# Patient Record
Sex: Female | Born: 1965
Health system: Southern US, Community
[De-identification: ages and names within clinical notes are randomized; demographics above are authoritative.]

## PROBLEM LIST (undated history)

## (undated) DIAGNOSIS — E559 Vitamin D deficiency, unspecified: Secondary | ICD-10-CM

## (undated) DIAGNOSIS — R0600 Dyspnea, unspecified: Secondary | ICD-10-CM

## (undated) DIAGNOSIS — K219 Gastro-esophageal reflux disease without esophagitis: Secondary | ICD-10-CM

## (undated) DIAGNOSIS — E039 Hypothyroidism, unspecified: Secondary | ICD-10-CM

## (undated) DIAGNOSIS — Z9889 Other specified postprocedural states: Secondary | ICD-10-CM

## (undated) DIAGNOSIS — E785 Hyperlipidemia, unspecified: Secondary | ICD-10-CM

## (undated) DIAGNOSIS — J339 Nasal polyp, unspecified: Secondary | ICD-10-CM

## (undated) DIAGNOSIS — J45909 Unspecified asthma, uncomplicated: Secondary | ICD-10-CM

## (undated) HISTORY — DX: Gastro-esophageal reflux disease without esophagitis: K21.9

## (undated) HISTORY — DX: Unspecified asthma, uncomplicated: J45.909

## (undated) HISTORY — DX: Vitamin D deficiency, unspecified: E55.9

## (undated) HISTORY — PX: CARPAL TUNNEL RELEASE: SHX101

## (undated) HISTORY — DX: Hypothyroidism, unspecified: E03.9

## (undated) HISTORY — DX: Nasal polyp, unspecified: J33.9

## (undated) HISTORY — PX: TMJ ARTHROPLASTY: SHX1066

## (undated) HISTORY — DX: Hyperlipidemia, unspecified: E78.5

---

## 2001-05-11 ENCOUNTER — Encounter: Admission: RE | Admit: 2001-05-11 | Discharge: 2001-05-11 | Payer: Self-pay | Admitting: Family Medicine

## 2001-05-11 ENCOUNTER — Encounter: Payer: Self-pay | Admitting: Family Medicine

## 2001-11-30 ENCOUNTER — Other Ambulatory Visit: Admission: RE | Admit: 2001-11-30 | Discharge: 2001-11-30 | Payer: Self-pay | Admitting: Physical Therapy

## 2002-12-26 ENCOUNTER — Other Ambulatory Visit: Admission: RE | Admit: 2002-12-26 | Discharge: 2002-12-26 | Payer: Self-pay | Admitting: Obstetrics and Gynecology

## 2004-01-01 ENCOUNTER — Other Ambulatory Visit: Admission: RE | Admit: 2004-01-01 | Discharge: 2004-01-01 | Payer: Self-pay | Admitting: Obstetrics and Gynecology

## 2005-03-01 ENCOUNTER — Other Ambulatory Visit: Admission: RE | Admit: 2005-03-01 | Discharge: 2005-03-01 | Payer: Self-pay | Admitting: Obstetrics and Gynecology

## 2006-03-09 ENCOUNTER — Other Ambulatory Visit: Admission: RE | Admit: 2006-03-09 | Discharge: 2006-03-09 | Payer: Self-pay | Admitting: Obstetrics & Gynecology

## 2006-03-14 ENCOUNTER — Encounter: Admission: RE | Admit: 2006-03-14 | Discharge: 2006-03-14 | Payer: Self-pay | Admitting: Obstetrics & Gynecology

## 2006-04-01 ENCOUNTER — Encounter: Admission: RE | Admit: 2006-04-01 | Discharge: 2006-04-01 | Payer: Self-pay | Admitting: *Deleted

## 2007-03-27 ENCOUNTER — Other Ambulatory Visit: Admission: RE | Admit: 2007-03-27 | Discharge: 2007-03-27 | Payer: Self-pay | Admitting: Obstetrics & Gynecology

## 2007-10-14 ENCOUNTER — Emergency Department (HOSPITAL_COMMUNITY): Admission: EM | Admit: 2007-10-14 | Discharge: 2007-10-14 | Payer: Self-pay | Admitting: Emergency Medicine

## 2007-10-14 ENCOUNTER — Encounter: Payer: Self-pay | Admitting: Emergency Medicine

## 2008-04-03 ENCOUNTER — Other Ambulatory Visit: Admission: RE | Admit: 2008-04-03 | Discharge: 2008-04-03 | Payer: Self-pay | Admitting: Obstetrics & Gynecology

## 2008-04-09 ENCOUNTER — Encounter: Admission: RE | Admit: 2008-04-09 | Discharge: 2008-04-09 | Payer: Self-pay | Admitting: Obstetrics & Gynecology

## 2009-10-13 ENCOUNTER — Encounter: Admission: RE | Admit: 2009-10-13 | Discharge: 2009-10-13 | Payer: Self-pay | Admitting: Obstetrics & Gynecology

## 2011-05-16 LAB — DIFFERENTIAL
Basophils Absolute: 0
Basophils Absolute: 0
Basophils Relative: 0
Eosinophils Relative: 3
Lymphocytes Relative: 18
Lymphocytes Relative: 21
Lymphs Abs: 1.6
Monocytes Absolute: 0.4
Neutro Abs: 4.7
Neutro Abs: 5.4

## 2011-05-16 LAB — BASIC METABOLIC PANEL
BUN: 14
CO2: 26
CO2: 26
Calcium: 9.3
Chloride: 108
GFR calc Af Amer: >60
GFR calc non Af Amer: >60
Glucose, Bld: 104 — ABNORMAL HIGH
Potassium: 4.2
Sodium: 140
Sodium: 141

## 2011-05-16 LAB — CBC
Hemoglobin: 13.5
MCHC: 34.8
Platelets: 289
Platelets: 321
RDW: 13
RDW: 13.5
WBC: 7.5

## 2012-08-29 ENCOUNTER — Other Ambulatory Visit: Payer: Self-pay | Admitting: Obstetrics & Gynecology

## 2012-08-29 DIAGNOSIS — Z1231 Encounter for screening mammogram for malignant neoplasm of breast: Secondary | ICD-10-CM

## 2012-09-24 ENCOUNTER — Ambulatory Visit
Admission: RE | Admit: 2012-09-24 | Discharge: 2012-09-24 | Disposition: A | Discharge status: Home / Self Care | Payer: Managed Care, Other (non HMO) | Source: Ambulatory Visit | Attending: Obstetrics & Gynecology | Admitting: Obstetrics & Gynecology

## 2012-09-24 DIAGNOSIS — Z1231 Encounter for screening mammogram for malignant neoplasm of breast: Secondary | ICD-10-CM

## 2012-12-03 ENCOUNTER — Telehealth: Payer: Self-pay | Admitting: Obstetrics & Gynecology

## 2012-12-03 MED ORDER — NORETHINDRONE 0.35 MG PO TABS: 1.0000 | ORAL_TABLET | Freq: Every day | ORAL | Status: DC

## 2012-12-03 NOTE — Telephone Encounter (Signed)
Not uncommon to skip cycles on [name].  Have pt do a home UPT.  If neg--she is fine.  OV if skips another month.

## 2012-12-03 NOTE — Telephone Encounter (Signed)
Yes, fine to refill until AEX.

## 2012-12-03 NOTE — Telephone Encounter (Signed)
09/22/12 no cycle since that date/pt concerned and wants to speak with nurse/wants to move appt. up if necessary/[place]

## 2012-12-03 NOTE — Telephone Encounter (Signed)
Spoke with pt to advise we will refill her OCP until her AEX. Pt uses [facility].  aa

## 2012-12-03 NOTE — Telephone Encounter (Signed)
Spoke with pt who is on Camila. Has not had a cycle since 09-22-12. Has not missed any pills. Pt has a history of missing 1 or 2 periods while on Camila, but not 2 cycles in a row. Pt just wanted to make SM aware and check to see if this is normal, or if she needs to come in. Please advise.  aa

## 2012-12-03 NOTE — Telephone Encounter (Signed)
Spoke with pt to advise skipping periods on Sara Nichols is common. Instructed pt to do a home pregnancy test to be sure. Call back for OV if she skips another month. Pt reports she needs a refill on her Sara Nichols until her AEX in June. OK to refill until then?  aa

## 2013-01-18 ENCOUNTER — Other Ambulatory Visit: Payer: Self-pay | Admitting: Family Medicine

## 2013-01-18 ENCOUNTER — Ambulatory Visit
Admission: RE | Admit: 2013-01-18 | Discharge: 2013-01-18 | Disposition: A | Discharge status: Home / Self Care | Payer: Managed Care, Other (non HMO) | Source: Ambulatory Visit | Attending: Family Medicine | Admitting: Family Medicine

## 2013-01-18 DIAGNOSIS — R05 Cough: Secondary | ICD-10-CM

## 2013-01-18 DIAGNOSIS — M542 Cervicalgia: Secondary | ICD-10-CM

## 2013-01-25 ENCOUNTER — Encounter: Payer: Self-pay | Admitting: Internal Medicine

## 2013-01-28 ENCOUNTER — Institutional Professional Consult (permissible substitution): Payer: Managed Care, Other (non HMO) | Admitting: Internal Medicine

## 2013-02-07 ENCOUNTER — Ambulatory Visit: Payer: Self-pay | Admitting: Obstetrics & Gynecology

## 2013-02-10 ENCOUNTER — Encounter: Payer: Self-pay | Admitting: Obstetrics & Gynecology

## 2013-02-12 ENCOUNTER — Encounter: Payer: Self-pay | Admitting: Obstetrics & Gynecology

## 2013-02-12 ENCOUNTER — Ambulatory Visit (INDEPENDENT_AMBULATORY_CARE_PROVIDER_SITE_OTHER): Payer: Managed Care, Other (non HMO) | Admitting: Obstetrics & Gynecology

## 2013-02-12 VITALS — BP 108/68 | HR 72 | Resp 16 | Ht 60.5 in | Wt 138.0 lb

## 2013-02-12 DIAGNOSIS — Z01419 Encounter for gynecological examination (general) (routine) without abnormal findings: Secondary | ICD-10-CM

## 2013-02-12 MED ORDER — NORETHINDRONE 0.35 MG PO TABS: 1.0000 | ORAL_TABLET | Freq: Every day | ORAL | Status: DC

## 2013-02-12 NOTE — Progress Notes (Signed)
47 y.o. G1P0000 MarriedCaucasianF here for annual exam.  No vaginal bleeding.  Doing really well.   Patient's last menstrual period was 01/24/2013.          Sexually active: yes  The current method of family planning is OCP (estrogen/progesterone).    Exercising: yes  Home exercise routine includes calisthenics. Smoker:  no  Health Maintenance: Pap:  01/17/12 normal, negative HPV History of abnormal Pap:  no MMG:  09/24/12 Colonoscopy:  n/a BMD:   n/a TDaP:  03/27/07 Screening Labs: pcp, Hb today: pcp, Urine today: declined labs today recent visit with PCP   reports that she has never smoked. She does not have any smokeless tobacco history on file. She reports that she does not drink alcohol or use illicit drugs.  Past Medical History  Diagnosis Date  . Hypothyroidism   . Asthma   . GERD (gastroesophageal reflux disease)   . Hyperlipidemia     since resolved  . Chronic cough   . Mild vitamin D deficiency     Past Surgical History  Procedure Laterality Date  . Tmj arthroplasty    . Iud removal    . Intrauterine device (iud) insertion      Current Outpatient Prescriptions  Medication Sig Dispense Refill  . Multiple Vitamins-Minerals (MULTIVITAMIN WITH MINERALS) tablet Take 1 tablet by mouth daily.      . norethindrone (MICRONOR,CAMILA,ERRIN) 0.35 MG tablet Take 1 tablet (0.35 mg total) by mouth daily.  3 Package  0  . ranitidine (ZANTAC) 300 MG capsule       . SYMBICORT 160-4.5 MCG/ACT inhaler Inhale 1 puff into the lungs 2 (two) times daily.      Marland Kitchen SYNTHROID 88 MCG tablet Take 88 mcg by mouth daily.       No current facility-administered medications for this visit.    Family History  Problem Relation Age of Onset  . Breast cancer Paternal Grandmother   . Colon cancer Maternal Grandfather 80  . Thyroid disease Mother   . Asthma Father     uses inhaler  . Hypertension Sister     ROS:  Pertinent items are noted in HPI.  Otherwise, a comprehensive ROS was  negative.  Exam:   BP 108/68  Pulse 72  Resp 16  Ht 5' 0.5" (1.537 m)  Wt 138 lb (62.596 kg)  BMI 26.5 kg/m2  LMP 01/24/2013  Weight change: @WEIGHTCHANGE @ Height:   Height: 5' 0.5" (153.7 cm)  Ht Readings from Last 3 Encounters:  02/12/13 5' 0.5" (1.537 m)    General appearance: alert, cooperative and appears stated age Head: Normocephalic, without obvious abnormality, atraumatic Neck: no adenopathy, supple, symmetrical, trachea midline and thyroid normal to inspection and palpation Lungs: clear to auscultation bilaterally Breasts: normal appearance, no masses or tenderness Heart: regular rate and rhythm Abdomen: soft, non-tender; bowel sounds normal; no masses,  no organomegaly Extremities: extremities normal, atraumatic, no cyanosis or edema Skin: Skin color, texture, turgor normal. No rashes or lesions Lymph nodes: Cervical, supraclavicular, and axillary nodes normal. No abnormal inguinal nodes palpated Neurologic: Grossly normal   Pelvic: External genitalia:  no lesions              Urethra:  normal appearing urethra with no masses, tenderness or lesions              Bartholins and Skenes: normal                 Vagina: normal appearing vagina with normal color and  discharge, no lesions              Cervix: no lesions              Pap taken: no Bimanual Exam:  Uterus:  normal size, contour, position, consistency, mobility, non-tender              Adnexa: normal adnexa and no mass, fullness, tenderness               Rectovaginal: Confirms               Anus:  normal sphincter tone, no lesions  A:  Well Woman with normal exam On [name] for B.C. Asthma, elevated lipids, hypothyroidism Family hx of breast cancer, MGM  P:   Mammogram yearly.  D/W pt 3D mmg. pap smear with neg HR HPV one year ago. Labs with PCP. Rx for camilla to pharmacy. return annually or prn  An After Visit Summary was printed and given to the patient.

## 2013-02-12 NOTE — Patient Instructions (Addendum)

## 2013-03-18 ENCOUNTER — Institutional Professional Consult (permissible substitution): Payer: Managed Care, Other (non HMO) | Admitting: Internal Medicine

## 2014-01-15 ENCOUNTER — Other Ambulatory Visit: Payer: Self-pay

## 2014-01-15 DIAGNOSIS — Z1231 Encounter for screening mammogram for malignant neoplasm of breast: Secondary | ICD-10-CM

## 2014-01-20 ENCOUNTER — Ambulatory Visit
Admission: RE | Admit: 2014-01-20 | Discharge: 2014-01-20 | Disposition: A | Discharge status: Home / Self Care | Payer: Managed Care, Other (non HMO) | Source: Ambulatory Visit

## 2014-01-20 ENCOUNTER — Encounter (INDEPENDENT_AMBULATORY_CARE_PROVIDER_SITE_OTHER): Payer: Self-pay

## 2014-01-20 DIAGNOSIS — Z1231 Encounter for screening mammogram for malignant neoplasm of breast: Secondary | ICD-10-CM

## 2014-02-19 ENCOUNTER — Telehealth: Payer: Self-pay | Admitting: *Deleted

## 2014-02-19 MED ORDER — NORETHINDRONE 0.35 MG PO TABS: 1.0000 | ORAL_TABLET | Freq: Every day | ORAL | Status: DC

## 2014-02-19 NOTE — Telephone Encounter (Signed)
[facility] Norethind[facility] 0.35 mg  Last Refilled: 02/12/13 #3 packs 4 refills  Aex Scheduled: 04/17/14 with Dr. Sabra Heck Last Mammogram: 01/20/14 Bi-Rads 1  Norethindrone 0.35 mg #3 packs w/ no refills sent to [facility] [place]  Routed to provider for review, encounter closed.

## 2014-02-20 MED ORDER — NORETHINDRONE 0.35 MG PO TABS: 1.0000 | ORAL_TABLET | Freq: Every day | ORAL | Status: DC

## 2014-02-20 NOTE — Addendum Note (Signed)
Addended by: Alfonzo Feller on: 02/20/2014 11:10 AM   Modules accepted: Orders

## 2014-04-17 ENCOUNTER — Ambulatory Visit (INDEPENDENT_AMBULATORY_CARE_PROVIDER_SITE_OTHER): Payer: Managed Care, Other (non HMO) | Admitting: Obstetrics & Gynecology

## 2014-04-17 ENCOUNTER — Encounter: Payer: Self-pay | Admitting: Obstetrics & Gynecology

## 2014-04-17 VITALS — BP 112/70 | HR 68 | Resp 16 | Ht 60.5 in | Wt 147.0 lb

## 2014-04-17 DIAGNOSIS — Z124 Encounter for screening for malignant neoplasm of cervix: Secondary | ICD-10-CM

## 2014-04-17 DIAGNOSIS — Z0000 Encounter for general adult medical examination without abnormal findings: Secondary | ICD-10-CM

## 2014-04-17 DIAGNOSIS — Z01419 Encounter for gynecological examination (general) (routine) without abnormal findings: Secondary | ICD-10-CM

## 2014-04-17 LAB — POCT URINALYSIS DIPSTICK
Bilirubin, UA: NEGATIVE
Glucose, UA: NEGATIVE
Ketones, UA: NEGATIVE
Leukocytes, UA: NEGATIVE
NITRITE UA: NEGATIVE
PH UA: 5
Protein, UA: NEGATIVE
RBC UA: NEGATIVE
UROBILINOGEN UA: NEGATIVE

## 2014-04-17 MED ORDER — NORETHINDRONE 0.35 MG PO TABS: 1.0000 | ORAL_TABLET | Freq: Every day | ORAL | Status: DC

## 2014-04-17 NOTE — Progress Notes (Signed)
48 y.o. G0P0000 MarriedCaucasianF here for annual exam.  Doing well.  Does skip cycles on occasion.  Cycle lasts up to seven days but usually they are shorter then that and light.  Saw Dr. Kenton Kingfisher in may.  Labs were all normal including cholesterol and thyroid.     Patient's last menstrual period was 04/10/2014.          Sexually active: Yes.    The current method of family planning is oral progesterone-only contraceptive.    Exercising: No.  exercise Smoker:  no  Health Maintenance: Pap: 01-17-12 neg HPV HR neg History of abnormal Pap:  no MMG: 01-20-14, neg,  Did 3D. Colonoscopy:  none BMD:   none TDaP:  03-27-07 Screening Labs: pcp, Hb today: pcp, Urine today: neg Self breast exam: done occ   reports that she has never smoked. She does not have any smokeless tobacco history on file. She reports that she drinks alcohol. She reports that she does not use illicit drugs.  Past Medical History  Diagnosis Date  . Hypothyroidism   . Asthma   . GERD (gastroesophageal reflux disease)   . Hyperlipidemia   . Mild vitamin D deficiency     Past Surgical History  Procedure Laterality Date  . Tmj arthroplasty      Current Outpatient Prescriptions  Medication Sig Dispense Refill  . DULERA 200-5 MCG/ACT AERO       . Multiple Vitamins-Minerals (MULTIVITAMIN WITH MINERALS) tablet Take 1 tablet by mouth daily.      . norethindrone (MICRONOR,CAMILA,ERRIN) 0.35 MG tablet Take 1 tablet (0.35 mg total) by mouth daily.  3 Package  0  . ranitidine (ZANTAC) 300 MG capsule       . SYNTHROID 88 MCG tablet Take 88 mcg by mouth daily.       No current facility-administered medications for this visit.    Family History  Problem Relation Age of Onset  . Breast cancer Paternal Grandmother   . Colon cancer Maternal Grandfather 30  . Thyroid disease Mother   . Asthma Father     uses inhaler  . Cancer Father     prostate & blood  . Hypertension Sister     ROS:  Pertinent items are noted in HPI.   Otherwise, a comprehensive ROS was negative.  Exam:   BP 112/70  Pulse 68  Resp 16  Ht 5' 0.5" (1.537 m)  Wt 147 lb (66.679 kg)  BMI 28.23 kg/m2  LMP 04/10/2014  Weight change: +9#   Height: 5' 0.5" (153.7 cm)  Ht Readings from Last 3 Encounters:  04/17/14 5' 0.5" (1.537 m)  02/12/13 5' 0.5" (1.537 m)    General appearance: alert, cooperative and appears stated age Head: Normocephalic, without obvious abnormality, atraumatic Neck: no adenopathy, supple, symmetrical, trachea midline and thyroid normal to inspection and palpation Lungs: clear to auscultation bilaterally Breasts: normal appearance, no masses or tenderness Heart: regular rate and rhythm Abdomen: soft, non-tender; bowel sounds normal; no masses,  no organomegaly Extremities: extremities normal, atraumatic, no cyanosis or edema Skin: Skin color, texture, turgor normal. No rashes or lesions Lymph nodes: Cervical, supraclavicular, and axillary nodes normal. No abnormal inguinal nodes palpated Neurologic: Grossly normal   Pelvic: External genitalia:  no lesions              Urethra:  normal appearing urethra with no masses, tenderness or lesions              Bartholins and Skenes: normal  Vagina: normal appearing vagina with normal color and discharge, no lesions              Cervix: no lesions              Pap taken: Yes.   Bimanual Exam:  Uterus:  normal size, contour, position, consistency, mobility, non-tender              Adnexa: normal adnexa and no mass, fullness, tenderness               Rectovaginal: Confirms               Anus:  normal sphincter tone, no lesions  A:  Well Woman with normal exam  On [name] for B.C.  Asthma H/O elevated lipids in past.  Normal currently Hypothyroidism  Family hx of breast cancer, MGM   P: Mammogram yearly. D/W pt 3D mmg.  pap smear with neg HR HPV 2013.  Pap only today.  Labs with PCP.  Rx for camilla to pharmacy.  return annually or prn     An  After Visit Summary was printed and given to the patient.

## 2014-04-21 LAB — IPS PAP TEST WITH REFLEX TO HPV

## 2015-02-03 ENCOUNTER — Other Ambulatory Visit: Payer: Self-pay | Admitting: Obstetrics & Gynecology

## 2015-02-03 ENCOUNTER — Other Ambulatory Visit: Payer: Self-pay

## 2015-02-03 DIAGNOSIS — Z1231 Encounter for screening mammogram for malignant neoplasm of breast: Secondary | ICD-10-CM

## 2015-02-03 MED ORDER — NORETHINDRONE 0.35 MG PO TABS: 1.0000 | ORAL_TABLET | Freq: Every day | ORAL | Status: DC

## 2015-02-03 NOTE — Telephone Encounter (Signed)
Medication refill request: Norethindrone Last AEX:  04-17-14 Next AEX: 06-01-15 Last MMG (if hormonal medication request): 01-21-14 WNL Refill authorized: WNL

## 2015-02-03 NOTE — Telephone Encounter (Signed)
Patient is requesting a refills of birth control until her next appointment 06/01/15 with Dr.Miller. Confirmed pharmacy on file with patient. Patient is requesting 3 refills.

## 2015-03-03 ENCOUNTER — Ambulatory Visit: Payer: Managed Care, Other (non HMO)

## 2015-04-01 ENCOUNTER — Ambulatory Visit
Admission: RE | Admit: 2015-04-01 | Discharge: 2015-04-01 | Disposition: A | Discharge status: Home / Self Care | Payer: BLUE CROSS/BLUE SHIELD | Source: Ambulatory Visit

## 2015-04-01 DIAGNOSIS — Z1231 Encounter for screening mammogram for malignant neoplasm of breast: Secondary | ICD-10-CM

## 2015-05-22 ENCOUNTER — Ambulatory Visit: Payer: Managed Care, Other (non HMO) | Admitting: Obstetrics & Gynecology

## 2015-06-01 ENCOUNTER — Ambulatory Visit (INDEPENDENT_AMBULATORY_CARE_PROVIDER_SITE_OTHER): Payer: BLUE CROSS/BLUE SHIELD | Admitting: Obstetrics & Gynecology

## 2015-06-01 ENCOUNTER — Encounter: Payer: Self-pay | Admitting: Obstetrics & Gynecology

## 2015-06-01 VITALS — BP 112/90 | HR 72 | Resp 18 | Wt 133.0 lb

## 2015-06-01 DIAGNOSIS — Z01419 Encounter for gynecological examination (general) (routine) without abnormal findings: Secondary | ICD-10-CM

## 2015-06-01 DIAGNOSIS — N951 Menopausal and female climacteric states: Secondary | ICD-10-CM | POA: Diagnosis not present

## 2015-06-01 DIAGNOSIS — R232 Flushing: Secondary | ICD-10-CM

## 2015-06-01 MED ORDER — NORETHINDRONE 0.35 MG PO TABS: 1.0000 | ORAL_TABLET | Freq: Every day | ORAL | Status: DC

## 2015-06-01 NOTE — Progress Notes (Addendum)
49 y.o. G0P0000 MarriedCaucasianF here for annual exam.  Reports she has skipped some cycles this past year.  She is on POPs.  Reports hot flashes have worsened this year.    Pt had thyroid tested in September.  Pt reports it was a little low.  Her dosage wasn't decreased but repeat labs were done last week.  Pt is waiting to hear about this results.  Had other blood work done this year as well.  Pt wants me to look at a couple of places on her skin.    Patient's last menstrual period was 03/25/2015 (approximate).          Sexually active: Yes.    The current method of family planning is OCP (estrogen/progesterone).    Exercising: Yes.    Walking Smoker:  no  Health Maintenance: Pap:  04/17/14 Neg, neg pap with neg HR HPV 2013.   History of abnormal Pap:  no MMG:  04/02/15 BIRADS1:neg TDaP:  2008 Screening Labs: PCP, Hb today: PCP, Urine today: PCP   reports that she has never smoked. She has never used smokeless tobacco. She reports that she drinks alcohol. She reports that she does not use illicit drugs.  Past Medical History  Diagnosis Date  . Hypothyroidism   . Asthma   . GERD (gastroesophageal reflux disease)   . Hyperlipidemia   . Mild vitamin D deficiency     Past Surgical History  Procedure Laterality Date  . Tmj arthroplasty      Current Outpatient Prescriptions  Medication Sig Dispense Refill  . DULERA 200-5 MCG/ACT AERO     . Multiple Vitamins-Minerals (MULTIVITAMIN WITH MINERALS) tablet Take 1 tablet by mouth daily.    . norethindrone (MICRONOR,CAMILA,ERRIN) 0.35 MG tablet Take 1 tablet (0.35 mg total) by mouth daily. 3 Package 1  . ranitidine (ZANTAC) 300 MG capsule     . SYNTHROID 88 MCG tablet Take 88 mcg by mouth daily.    . valACYclovir (VALTREX) 1000 MG tablet      No current facility-administered medications for this visit.    Family History  Problem Relation Age of Onset  . Breast cancer Paternal Grandmother   . Colon cancer Maternal Grandfather 43   . Thyroid disease Mother   . Asthma Father     uses inhaler  . Cancer Father     prostate & blood  . Hypertension Sister     ROS:  Pertinent items are noted in HPI.  Otherwise, a comprehensive ROS was negative.  Exam:   BP 112/90 mmHg  Pulse 72  Resp 18  Wt 133 lb (60.328 kg)  LMP 03/25/2015 (Approximate)  Weight change: --14#     Ht Readings from Last 3 Encounters:  04/17/14 5' 0.5" (1.537 m)  02/12/13 5' 0.5" (1.537 m)    General appearance: alert, cooperative and appears stated age Head: Normocephalic, without obvious abnormality, atraumatic Neck: no adenopathy, supple, symmetrical, trachea midline and thyroid normal to inspection and palpation Lungs: clear to auscultation bilaterally Breasts: normal appearance, no masses or tenderness Heart: regular rate and rhythm Abdomen: soft, non-tender; bowel sounds normal; no masses,  no organomegaly Extremities: extremities normal, atraumatic, no cyanosis or edema Skin: Skin color, texture, turgor normal. No rashes or lesions Lymph nodes: Cervical, supraclavicular, and axillary nodes normal. No abnormal inguinal nodes palpated Neurologic: Grossly normal   Pelvic: External genitalia:  no lesions              Urethra:  normal appearing urethra with no masses,  tenderness or lesions              Bartholins and Skenes: normal                 Vagina: normal appearing vagina with normal color and discharge, no lesions              Cervix: no lesions              Pap taken: No. Bimanual Exam:  Uterus:  normal size, contour, position, consistency, mobility, non-tender              Adnexa: normal adnexa and no mass, fullness, tenderness               Rectovaginal: Confirms               Anus:  normal sphincter tone, no lesions  Chaperone was present for exam.  A:  Well Woman with normal exam  On [name] for B.C.  Asthma H/O elevated lipids in past. Normal currently Hypothyroidism  Hot flashes Family hx of breast cancer,  MGM   P: Mammogram yearly. D/W pt 3D mmg.  pap smear with neg HR HPV 2013. Pap neg 2015.  No pap today. Labs with PCP.  Rx for camilla to pharmacy for 90 day supply/4RF. [place] today.  (was 33.9 in 2010) Name and number for dermatologist given. return annually or prn

## 2015-06-01 NOTE — Addendum Note (Signed)
Addended by: Megan Salon on: 06/01/2015 02:26 PM   Modules accepted: Sara Nichols

## 2015-06-01 NOTE — Patient Instructions (Signed)
Sara Nichols, [facility] Phone: [phone]

## 2015-06-02 ENCOUNTER — Encounter: Payer: Self-pay | Admitting: Obstetrics & Gynecology

## 2015-06-02 ENCOUNTER — Telehealth: Payer: Self-pay

## 2015-06-02 LAB — FOLLICLE STIMULATING HORMONE: FSH: 65 m[IU]/mL

## 2015-06-02 NOTE — Telephone Encounter (Signed)
-----   Message from Megan Salon, MD sent at 06/02/2015 11:12 AM EDT ----- Please inform pt she is in full menopausal range.  I know she is not interested in taking estrogen.  So she should use black cohosh 40mg  bid and if she wants to try some topical progesterone cream from health food store, she can also do this.  She should follow instructions on the package for this.  Also, she should not have another cycle and if she does, I want her to call.  It is common for this level to bounce around a little in women who are younger so I would stay on the micronor for one more year and I will repeat the level then OR she can stop it but use condoms just to be safe.

## 2015-06-02 NOTE — Telephone Encounter (Signed)
Returned call

## 2015-06-02 NOTE — Telephone Encounter (Signed)
Lmtcb//kn 

## 2015-06-07 ENCOUNTER — Encounter: Payer: Self-pay | Admitting: Obstetrics & Gynecology

## 2015-06-09 NOTE — Telephone Encounter (Signed)
Lmtcb//kn 

## 2015-06-22 NOTE — Telephone Encounter (Signed)
Patient notified of results.//kn 

## 2015-06-22 NOTE — Telephone Encounter (Signed)
-----   Message from Megan Salon, MD sent at 06/03/2015  8:22 AM EDT ----- Regarding: mychart message Mrs Dahlia Client sent a mychart message and I answered her questions with the following.  You can document on your note that she was communicated with via MyChart and cut and paste this if you want into the note.  Thanks.  Vinnie Level  Pt note:  Your Tyro level showed you were in full menopausal range.  That is why you are experiencing the symptoms you currently have.  I sometimes see this level bounce around in women in their 79's who go into menopause a little early.  It is safe to stay on the oral contraception or you can stop this but I would probably use condoms to be sure.  I will plan to repeat the level next year.  If it is still elevated, then we know there is no risk for pregnancy and you can stop the contraception completely.  You should have not any bleeding, so if you do, please call.  I would do an ultrasound at that point.  I don't think you want to be on estrogens so I would either restart the Estroven as it helped or try the black cohosh, 40mg  twice daily.  Hope that is helpful.  Please let me know if you have any other questions.  Dr. Sabra Heck

## 2015-10-28 ENCOUNTER — Telehealth: Payer: Self-pay

## 2015-10-28 ENCOUNTER — Encounter: Payer: Self-pay | Admitting: Obstetrics & Gynecology

## 2015-10-28 NOTE — Telephone Encounter (Signed)
Telephone encounter created to discuss mychart encounter with patient and Dr.Miller.

## 2015-10-28 NOTE — Telephone Encounter (Signed)
Non-Urgent Medical Question  Message [id number]   From  [place]   To  Megan Salon, MD   Sent  10/28/2015 5:03 PM     Hi Dr. Sabra Heck. Wanted to check in with you about my cycle. I have not had a period since August. I did spot one day either in January or February. Today was a little heavier and had a clot when I used the bathroom. Just want to make sure this is a normal process. Thank you for your time,      Responsible Party    Pool - [facility] Pool No one has taken responsibility for this message.     No actions have been taken on this message.     Patient was last seen in office on 06/01/2015. At that [facility] was 65. She was advised that this level may fluctuate due to her age. It was recommended that she stay on Micronor for an additional year (Please see result note dated 06/01/2015). Spoke with patient. Patient states that she had not had any bleeding since August 2016 until January 2017. In January 2017 she reports she had 1 day of dark brown discharge. Today when she went to the restroom she passed a quarter size clot and is having dark brown discharge again. Is wearing a panty liner and reports she is not having to change it frequently. Patient is taking Micronor daily. Has not missed or taken any pills late. Denies any pelvic discomfort. Advised I will speak with Dr.Miller and return call with additional recommendations. She is agreeable.

## 2015-10-29 NOTE — Telephone Encounter (Signed)
Message sent to pt via [place].  Ok to close encounter.  Mrs. Canupp, I am not surprised that you skipped for so many months as your [facility] was 67 (in menopausal range) when it was last checked.  You are a little young for this so it is not worrisome to have a cycle here and there.  Please call if you have any additional bleeding.  Thanks.  Dr. Sabra Heck

## 2015-11-23 DIAGNOSIS — M7712 Lateral epicondylitis, left elbow: Secondary | ICD-10-CM | POA: Diagnosis not present

## 2015-12-30 DIAGNOSIS — M7712 Lateral epicondylitis, left elbow: Secondary | ICD-10-CM | POA: Diagnosis not present

## 2016-02-18 DIAGNOSIS — R590 Localized enlarged lymph nodes: Secondary | ICD-10-CM | POA: Diagnosis not present

## 2016-02-18 DIAGNOSIS — S1086XA Insect bite of other specified part of neck, initial encounter: Secondary | ICD-10-CM | POA: Diagnosis not present

## 2016-06-14 ENCOUNTER — Other Ambulatory Visit: Payer: Self-pay | Admitting: Obstetrics & Gynecology

## 2016-06-14 DIAGNOSIS — Z1231 Encounter for screening mammogram for malignant neoplasm of breast: Secondary | ICD-10-CM

## 2016-07-04 ENCOUNTER — Ambulatory Visit: Payer: BLUE CROSS/BLUE SHIELD

## 2016-07-04 ENCOUNTER — Ambulatory Visit
Admission: RE | Admit: 2016-07-04 | Discharge: 2016-07-04 | Disposition: A | Discharge status: Home / Self Care | Payer: BLUE CROSS/BLUE SHIELD | Source: Ambulatory Visit | Attending: Obstetrics & Gynecology | Admitting: Obstetrics & Gynecology

## 2016-07-04 DIAGNOSIS — Z1231 Encounter for screening mammogram for malignant neoplasm of breast: Secondary | ICD-10-CM | POA: Diagnosis not present

## 2016-07-07 DIAGNOSIS — E782 Mixed hyperlipidemia: Secondary | ICD-10-CM | POA: Diagnosis not present

## 2016-07-07 DIAGNOSIS — L301 Dyshidrosis [pompholyx]: Secondary | ICD-10-CM | POA: Diagnosis not present

## 2016-07-07 DIAGNOSIS — E559 Vitamin D deficiency, unspecified: Secondary | ICD-10-CM | POA: Diagnosis not present

## 2016-07-07 DIAGNOSIS — K219 Gastro-esophageal reflux disease without esophagitis: Secondary | ICD-10-CM | POA: Diagnosis not present

## 2016-07-07 DIAGNOSIS — J309 Allergic rhinitis, unspecified: Secondary | ICD-10-CM | POA: Diagnosis not present

## 2016-07-07 DIAGNOSIS — E039 Hypothyroidism, unspecified: Secondary | ICD-10-CM | POA: Diagnosis not present

## 2016-08-04 ENCOUNTER — Other Ambulatory Visit: Payer: Self-pay | Admitting: Obstetrics & Gynecology

## 2016-08-04 NOTE — Telephone Encounter (Signed)
Medication refill request: Camila 0.35mg   Last AEX:  06/01/15 SM Next AEX: 09/22/16  Last MMG (if hormonal medication request): 07/04/16 BIRADS 1 negative Refill authorized: 06/01/15 #3packs w/4 refills; today please advise

## 2016-08-08 DIAGNOSIS — R200 Anesthesia of skin: Secondary | ICD-10-CM | POA: Diagnosis not present

## 2016-09-13 DIAGNOSIS — R200 Anesthesia of skin: Secondary | ICD-10-CM | POA: Diagnosis not present

## 2016-09-22 ENCOUNTER — Encounter: Payer: Self-pay | Admitting: Obstetrics & Gynecology

## 2016-09-22 ENCOUNTER — Ambulatory Visit (INDEPENDENT_AMBULATORY_CARE_PROVIDER_SITE_OTHER): Payer: BLUE CROSS/BLUE SHIELD | Admitting: Obstetrics & Gynecology

## 2016-09-22 VITALS — BP 110/72 | HR 93 | Resp 12 | Ht 60.25 in | Wt 147.0 lb

## 2016-09-22 DIAGNOSIS — Z124 Encounter for screening for malignant neoplasm of cervix: Secondary | ICD-10-CM

## 2016-09-22 DIAGNOSIS — N912 Amenorrhea, unspecified: Secondary | ICD-10-CM

## 2016-09-22 DIAGNOSIS — Z23 Encounter for immunization: Secondary | ICD-10-CM

## 2016-09-22 DIAGNOSIS — R748 Abnormal levels of other serum enzymes: Secondary | ICD-10-CM | POA: Diagnosis not present

## 2016-09-22 DIAGNOSIS — Z0000 Encounter for general adult medical examination without abnormal findings: Secondary | ICD-10-CM | POA: Diagnosis not present

## 2016-09-22 DIAGNOSIS — Z01419 Encounter for gynecological examination (general) (routine) without abnormal findings: Secondary | ICD-10-CM

## 2016-09-22 LAB — HEPATIC FUNCTION PANEL
ALBUMIN: 4.2 g/dL (ref 3.6–5.1)
ALK PHOS: 61 U/L (ref 33–115)
ALT: 35 U/L — ABNORMAL HIGH (ref 6–29)
AST: 23 U/L (ref 10–35)
Bilirubin, Direct: 0.1 mg/dL (ref <=0.2)
Indirect Bilirubin: 0.3 mg/dL (ref 0.2–1.2)
Total Bilirubin: 0.4 mg/dL (ref 0.2–1.2)
Total Protein: 6.8 g/dL (ref 6.1–8.1)

## 2016-09-22 LAB — LIPID PANEL
CHOL/HDL RATIO: 3.8 ratio (ref <5.0)
Cholesterol: 194 mg/dL (ref <200)
HDL: 51 mg/dL (ref >50)
LDL CALC: 126 mg/dL — AB (ref <100)
Triglycerides: 87 mg/dL (ref <150)
VLDL: 17 mg/dL (ref <30)

## 2016-09-22 MED ORDER — NORETHINDRONE 0.35 MG PO TABS: 1.0000 | ORAL_TABLET | Freq: Every day | ORAL | 4 refills | Status: DC

## 2016-09-22 NOTE — Progress Notes (Signed)
51 y.o. G0P0000 MarriedCaucasianF here for annual exam.  She had lab work in November that showed elevated liver enzymes.  Cholesterol is elevated and Dr. Kenton Kingfisher is considering starting lipid agent.  Pt was supposed to return in one month for lab work.  Needs that done today.  She was taking a lot of Tylenol due to carpel tunnel and she's having surgery in one week for this.  She is not taking any more Tylenol.    No vaginal bleeding since august.  Patient's last menstrual period was 03/26/2016.          Sexually active: Yes.    The current method of family planning is OCP (estrogen/progesterone).    Exercising: No.  The patient does not participate in regular exercise at present. Smoker:  no  Health Maintenance: Pap:  04/17/14 negative  History of abnormal Pap:  no MMG:  07/04/16 BIRADS 1 negative  Colonoscopy:  never BMD:   never TDaP:  03/27/07- would like to update today  Pneumonia vaccine(s):  never Zostavax:   never Hep C testing: not indicated  Screening Labs: discuss with provider, Hb today: same, Urine today: declined   reports that she has never smoked. She has never used smokeless tobacco. She reports that she drinks alcohol. She reports that she does not use drugs.  Past Medical History:  Diagnosis Date  . Asthma   . GERD (gastroesophageal reflux disease)   . Hyperlipidemia   . Hypothyroidism   . Mild vitamin D deficiency     Past Surgical History:  Procedure Laterality Date  . TMJ ARTHROPLASTY      Current Outpatient Prescriptions  Medication Sig Dispense Refill  . augmented betamethasone dipropionate (DIPROLENE-AF) 0.05 % ointment APPLY TO AFFECTED AREA TWICE A DAY  1  . CAMILA 0.35 MG tablet TAKE 1 TABLET (0.35 MG TOTAL) BY MOUTH DAILY. 84 tablet 2  . DULERA 200-5 MCG/ACT AERO     . gabapentin (NEURONTIN) 300 MG capsule     . Multiple Vitamins-Minerals (MULTIVITAMIN WITH MINERALS) tablet Take 1 tablet by mouth daily.    . ranitidine (ZANTAC) 300 MG capsule      . SYNTHROID 88 MCG tablet Take 88 mcg by mouth daily.    . traMADol (ULTRAM) 50 MG tablet     . valACYclovir (VALTREX) 1000 MG tablet      No current facility-administered medications for this visit.     Family History  Problem Relation Age of Onset  . Breast cancer Paternal Grandmother   . Colon cancer Maternal Grandfather 26  . Thyroid disease Mother   . Asthma Father     uses inhaler  . Cancer Father     prostate & blood  . Hypertension Sister     ROS:  Pertinent items are noted in HPI.  Otherwise, a comprehensive ROS was negative.  Exam:   BP 110/72 (BP Location: Right Arm, Patient Position: Sitting, Cuff Size: Normal)   Pulse 93   Resp 12   Ht 5' 0.25" (1.53 m)   Wt 147 lb (66.7 kg)   LMP 03/26/2016   BMI 28.47 kg/m   Height: 5' 0.25" (153 cm)  Ht Readings from Last 3 Encounters:  09/22/16 5' 0.25" (1.53 m)  04/17/14 5' 0.5" (1.537 m)  02/12/13 5' 0.5" (1.537 m)    General appearance: alert, cooperative and appears stated age Head: Normocephalic, without obvious abnormality, atraumatic Neck: no adenopathy, supple, symmetrical, trachea midline and thyroid normal to inspection and palpation Lungs: clear to  auscultation bilaterally Breasts: normal appearance, no masses or tenderness Heart: regular rate and rhythm Abdomen: soft, non-tender; bowel sounds normal; no masses,  no organomegaly Extremities: extremities normal, atraumatic, no cyanosis or edema Skin: Skin color, texture, turgor normal. No rashes or lesions Lymph nodes: Cervical, supraclavicular, and axillary nodes normal. No abnormal inguinal nodes palpated Neurologic: Grossly normal   Pelvic: External genitalia:  no lesions              Urethra:  normal appearing urethra with no masses, tenderness or lesions              Bartholins and Skenes: normal                 Vagina: normal appearing vagina with normal color and discharge, no lesions              Cervix: no lesions              Pap taken:  Yes.   Bimanual Exam:  Uterus:  normal size, contour, position, consistency, mobility, non-tender              Adnexa: normal adnexa and no mass, fullness, tenderness               Rectovaginal: Confirms               Anus:  normal sphincter tone, no lesions  Chaperone was present for exam.  A:     Well Woman with normal exam  On POP for contraception Asthma H/O elevated lipids and elevated liver enzymes in November Hypothyroidism  Family hx of breast cancer, MGM   P:  Mammogram yearly. D/W pt 3D mmg.  Pap and HR HPV today FSH (was 33.9 in 2010) Lipids and liver panel.  Rx for camilla to pharmacy for 90 day supply/4RF Tdap  Return annually or prn

## 2016-09-23 LAB — FOLLICLE STIMULATING HORMONE: FSH: 14.7 m[IU]/mL

## 2016-09-26 LAB — IPS PAP TEST WITH HPV

## 2016-09-28 ENCOUNTER — Encounter: Payer: Self-pay | Admitting: Obstetrics & Gynecology

## 2016-09-28 DIAGNOSIS — G5601 Carpal tunnel syndrome, right upper limb: Secondary | ICD-10-CM | POA: Diagnosis not present

## 2016-09-30 DIAGNOSIS — G5601 Carpal tunnel syndrome, right upper limb: Secondary | ICD-10-CM | POA: Diagnosis not present

## 2016-09-30 DIAGNOSIS — M659 Synovitis and tenosynovitis, unspecified: Secondary | ICD-10-CM | POA: Diagnosis not present

## 2016-09-30 DIAGNOSIS — M65841 Other synovitis and tenosynovitis, right hand: Secondary | ICD-10-CM | POA: Diagnosis not present

## 2016-10-05 DIAGNOSIS — G5601 Carpal tunnel syndrome, right upper limb: Secondary | ICD-10-CM | POA: Diagnosis not present

## 2016-11-02 DIAGNOSIS — M79643 Pain in unspecified hand: Secondary | ICD-10-CM | POA: Diagnosis not present

## 2016-11-09 DIAGNOSIS — G5601 Carpal tunnel syndrome, right upper limb: Secondary | ICD-10-CM | POA: Diagnosis not present

## 2017-02-08 DIAGNOSIS — G5602 Carpal tunnel syndrome, left upper limb: Secondary | ICD-10-CM | POA: Diagnosis not present

## 2017-06-12 DIAGNOSIS — G5602 Carpal tunnel syndrome, left upper limb: Secondary | ICD-10-CM | POA: Diagnosis not present

## 2017-06-12 DIAGNOSIS — G5601 Carpal tunnel syndrome, right upper limb: Secondary | ICD-10-CM | POA: Diagnosis not present

## 2017-06-21 ENCOUNTER — Other Ambulatory Visit: Payer: Self-pay | Admitting: Obstetrics & Gynecology

## 2017-06-21 DIAGNOSIS — Z1231 Encounter for screening mammogram for malignant neoplasm of breast: Secondary | ICD-10-CM

## 2017-07-07 DIAGNOSIS — N951 Menopausal and female climacteric states: Secondary | ICD-10-CM | POA: Diagnosis not present

## 2017-07-07 DIAGNOSIS — E782 Mixed hyperlipidemia: Secondary | ICD-10-CM | POA: Diagnosis not present

## 2017-07-07 DIAGNOSIS — E039 Hypothyroidism, unspecified: Secondary | ICD-10-CM | POA: Diagnosis not present

## 2017-07-07 DIAGNOSIS — J4520 Mild intermittent asthma, uncomplicated: Secondary | ICD-10-CM | POA: Diagnosis not present

## 2017-07-07 DIAGNOSIS — E559 Vitamin D deficiency, unspecified: Secondary | ICD-10-CM | POA: Diagnosis not present

## 2017-07-17 ENCOUNTER — Ambulatory Visit
Admission: RE | Admit: 2017-07-17 | Discharge: 2017-07-17 | Disposition: A | Discharge status: Home / Self Care | Payer: BLUE CROSS/BLUE SHIELD | Source: Ambulatory Visit | Attending: Obstetrics & Gynecology | Admitting: Obstetrics & Gynecology

## 2017-07-17 DIAGNOSIS — Z1231 Encounter for screening mammogram for malignant neoplasm of breast: Secondary | ICD-10-CM

## 2017-08-04 DIAGNOSIS — G5602 Carpal tunnel syndrome, left upper limb: Secondary | ICD-10-CM | POA: Diagnosis not present

## 2017-08-17 DIAGNOSIS — G5602 Carpal tunnel syndrome, left upper limb: Secondary | ICD-10-CM | POA: Diagnosis not present

## 2017-11-06 DIAGNOSIS — G5601 Carpal tunnel syndrome, right upper limb: Secondary | ICD-10-CM | POA: Diagnosis not present

## 2017-11-06 DIAGNOSIS — G5602 Carpal tunnel syndrome, left upper limb: Secondary | ICD-10-CM | POA: Diagnosis not present

## 2017-11-19 ENCOUNTER — Other Ambulatory Visit: Payer: Self-pay | Admitting: Obstetrics & Gynecology

## 2017-11-20 NOTE — Telephone Encounter (Signed)
Medication refill request: Camila Last AEX:  09/22/16 SM  Next AEX: 01/11/18  Last MMG (if hormonal medication request): 07/17/17 BIRADS 1 negative  Refill authorized: 09/22/16 #83, 4RF. Today, please advise.

## 2018-01-11 ENCOUNTER — Other Ambulatory Visit: Payer: Self-pay

## 2018-01-11 ENCOUNTER — Ambulatory Visit: Payer: BLUE CROSS/BLUE SHIELD | Admitting: Obstetrics & Gynecology

## 2018-01-11 ENCOUNTER — Encounter: Payer: Self-pay | Admitting: Obstetrics & Gynecology

## 2018-01-11 VITALS — BP 120/88 | HR 88 | Resp 16 | Ht 60.5 in | Wt 141.0 lb

## 2018-01-11 DIAGNOSIS — K219 Gastro-esophageal reflux disease without esophagitis: Secondary | ICD-10-CM

## 2018-01-11 DIAGNOSIS — Z01419 Encounter for gynecological examination (general) (routine) without abnormal findings: Secondary | ICD-10-CM | POA: Diagnosis not present

## 2018-01-11 DIAGNOSIS — Z1211 Encounter for screening for malignant neoplasm of colon: Secondary | ICD-10-CM | POA: Diagnosis not present

## 2018-01-11 DIAGNOSIS — Z0000 Encounter for general adult medical examination without abnormal findings: Secondary | ICD-10-CM

## 2018-01-11 DIAGNOSIS — J45909 Unspecified asthma, uncomplicated: Secondary | ICD-10-CM | POA: Diagnosis not present

## 2018-01-11 DIAGNOSIS — E785 Hyperlipidemia, unspecified: Secondary | ICD-10-CM | POA: Insufficient documentation

## 2018-01-11 DIAGNOSIS — E7800 Pure hypercholesterolemia, unspecified: Secondary | ICD-10-CM | POA: Diagnosis not present

## 2018-01-11 DIAGNOSIS — N912 Amenorrhea, unspecified: Secondary | ICD-10-CM | POA: Diagnosis not present

## 2018-01-11 DIAGNOSIS — R748 Abnormal levels of other serum enzymes: Secondary | ICD-10-CM

## 2018-01-11 DIAGNOSIS — E039 Hypothyroidism, unspecified: Secondary | ICD-10-CM | POA: Insufficient documentation

## 2018-01-11 MED ORDER — VALACYCLOVIR HCL 1 G PO TABS: 1000.0000 mg | ORAL_TABLET | Freq: Every day | ORAL | 1 refills | Status: DC | PRN

## 2018-01-11 NOTE — Progress Notes (Signed)
52 y.o. G0P0000 MarriedCaucasianF here for annual exam.  Denies vaginal bleeding.  Has not had any bleeding in the last year.  Feels good with the micronor.  Did not see dermatology last year.  D/w pt importance due to sun exposure.  [place]    Having some issues with asthma.  Inhaler doesn't seem to be helping that much.  Having a lot of mucous production.  Knows should discuss with PCP as he has been managing this.  However, possible pulmonology consultation discussed.  PCP:  Dr. Kenton Kingfisher.  Patient's last menstrual period was 10/20/2016 (approximate).          Sexually active: Yes.    The current method of family planning is OCP.   Exercising: No.   Smoker:  no  Health Maintenance: Pap:  09/22/16 Neg. HR HPV:neg   04/17/14 Neg History of abnormal Pap:  no MMG:  07/17/17 BIRADS1:Neg Colonoscopy:  Never BMD:   Never TDaP:  2018 Screening Labs: PCP   reports that she has never smoked. She has never used smokeless tobacco. She reports that she drinks alcohol. She reports that she does not use drugs.  Past Medical History:  Diagnosis Date  . Asthma   . GERD (gastroesophageal reflux disease)   . Hyperlipidemia   . Hypothyroidism   . Mild vitamin D deficiency     Past Surgical History:  Procedure Laterality Date  . CARPAL TUNNEL RELEASE Bilateral 07/2016, 07/2017  . TMJ ARTHROPLASTY      Current Outpatient Medications  Medication Sig Dispense Refill  . Budesonide-Formoterol Fumarate (SYMBICORT IN) Inhale into the lungs 2 (two) times daily.    Marland Kitchen CAMILA 0.35 MG tablet TAKE 1 TABLET DAILY 84 tablet 0  . Multiple Vitamins-Minerals (MULTIVITAMIN WITH MINERALS) tablet Take 1 tablet by mouth daily.    . ranitidine (ZANTAC) 300 MG capsule     . SYNTHROID 88 MCG tablet Take 88 mcg by mouth daily.    . valACYclovir (VALTREX) 1000 MG tablet Take 1,000 mg by mouth daily as needed.     Marland Kitchen augmented betamethasone dipropionate (DIPROLENE-AF) 0.05 % ointment APPLY TO AFFECTED AREA  TWICE A DAY  1   No current facility-administered medications for this visit.     Family History  Problem Relation Age of Onset  . Thyroid disease Mother   . Asthma Father        uses inhaler  . Cancer Father        prostate & blood  . Hypertension Sister   . Breast cancer Paternal Grandmother   . Colon cancer Maternal Grandfather 12    Review of Systems  All other systems reviewed and are negative.   Exam:   BP 120/88 (BP Location: Left Arm, Patient Position: Sitting, Cuff Size: Normal)   Pulse 88   Resp 16   Ht 5' 0.5" (1.537 m)   Wt 141 lb (64 kg)   LMP 10/20/2016 (Approximate)   BMI 27.08 kg/m   Height: 5' 0.5" (153.7 cm)  Ht Readings from Last 3 Encounters:  01/11/18 5' 0.5" (1.537 m)  09/22/16 5' 0.25" (1.53 m)  04/17/14 5' 0.5" (1.537 m)    General appearance: alert, cooperative and appears stated age Head: Normocephalic, without obvious abnormality, atraumatic Neck: no adenopathy, supple, symmetrical, trachea midline and thyroid normal to inspection and palpation Lungs: clear to auscultation bilaterally Breasts: normal appearance, no masses or tenderness Heart: regular rate and rhythm Abdomen: soft, non-tender; bowel sounds normal; no masses,  no  organomegaly Extremities: extremities normal, atraumatic, no cyanosis or edema Skin: Skin color, texture, turgor normal. No rashes or lesions Lymph nodes: Cervical, supraclavicular, and axillary nodes normal. No abnormal inguinal nodes palpated Neurologic: Grossly normal   Pelvic: External genitalia:  no lesions              Urethra:  normal appearing urethra with no masses, tenderness or lesions              Bartholins and Skenes: normal                 Vagina: normal appearing vagina with normal color and discharge, no lesions              Cervix: no lesions              Pap taken: No. Bimanual Exam:  Uterus:  normal size, contour, position, consistency, mobility, non-tender              Adnexa: normal  adnexa and no mass, fullness, tenderness               Rectovaginal: Confirms               Anus:  normal sphincter tone, no lesions  Chaperone was present for exam.  A:  Well Woman with normal exam Amenorrhea on micronor Asthma H/o elevated lipids, fasting today Elevate liver enzyme last year Family hx of breast cancer, MGM H/o oral HSV  P:   Mammogram yearly.  Doing 3D. pap smear with neg HR HPV 2018.  No indicated today. [place] will be obtained.  Can stop camilla if elevated.  FLP, CMP, TSH, Vit D, CBC obtained today. Valtrex 1gm.  2 tab po x 1, repeat 12 hours.  Rx to pharmacy for #30/1RF Referral for screening colonoscopy Return annually or prn

## 2018-01-11 NOTE — Patient Instructions (Signed)
Marcine Matar.  Dermatologist at [facility] [address], [place], [place] [address]  Phone: [phone]

## 2018-01-12 LAB — COMPREHENSIVE METABOLIC PANEL
ALBUMIN: 4.6 g/dL (ref 3.5–5.5)
ALT: 32 IU/L (ref 0–32)
AST: 22 IU/L (ref 0–40)
Albumin/Globulin Ratio: 1.6 (ref 1.2–2.2)
Alkaline Phosphatase: 80 IU/L (ref 39–117)
BUN / CREAT RATIO: 13 (ref 9–23)
BUN: 13 mg/dL (ref 6–24)
Bilirubin Total: 0.4 mg/dL (ref 0.0–1.2)
CALCIUM: 9.4 mg/dL (ref 8.7–10.2)
CO2: 22 mmol/L (ref 20–29)
CREATININE: 1.01 mg/dL — AB (ref 0.57–1.00)
Chloride: 104 mmol/L (ref 96–106)
GFR, EST AFRICAN AMERICAN: 76 mL/min/{1.73_m2} (ref >59)
GFR, EST NON AFRICAN AMERICAN: 66 mL/min/{1.73_m2} (ref >59)
GLUCOSE: 89 mg/dL (ref 65–99)
Globulin, Total: 2.8 g/dL (ref 1.5–4.5)
Potassium: 4.4 mmol/L (ref 3.5–5.2)
Sodium: 141 mmol/L (ref 134–144)
TOTAL PROTEIN: 7.4 g/dL (ref 6.0–8.5)

## 2018-01-12 LAB — FOLLICLE STIMULATING HORMONE: FSH: 52.7 m[IU]/mL

## 2018-01-12 LAB — CBC
Hematocrit: 45.7 % (ref 34.0–46.6)
Hemoglobin: 14.9 g/dL (ref 11.1–15.9)
MCH: 29.7 pg (ref 26.6–33.0)
MCHC: 32.6 g/dL (ref 31.5–35.7)
MCV: 91 fL (ref 79–97)
PLATELETS: 353 10*3/uL (ref 150–450)
RBC: 5.01 x10E6/uL (ref 3.77–5.28)
RDW: 13.9 % (ref 12.3–15.4)
WBC: 6.3 10*3/uL (ref 3.4–10.8)

## 2018-01-12 LAB — LIPID PANEL
CHOL/HDL RATIO: 3.6 ratio (ref 0.0–4.4)
Cholesterol, Total: 210 mg/dL — ABNORMAL HIGH (ref 100–199)
HDL: 58 mg/dL (ref >39)
LDL CALC: 135 mg/dL — AB (ref 0–99)
Triglycerides: 84 mg/dL (ref 0–149)
VLDL CHOLESTEROL CAL: 17 mg/dL (ref 5–40)

## 2018-01-12 LAB — TSH: TSH: 1.72 u[IU]/mL (ref 0.450–4.500)

## 2018-01-12 LAB — VITAMIN D 25 HYDROXY (VIT D DEFICIENCY, FRACTURES): Vit D, 25-Hydroxy: 57 ng/mL (ref 30.0–100.0)

## 2018-01-16 ENCOUNTER — Encounter: Payer: Self-pay | Admitting: Obstetrics & Gynecology

## 2018-01-16 ENCOUNTER — Telehealth: Payer: Self-pay | Admitting: *Deleted

## 2018-01-16 NOTE — Telephone Encounter (Signed)
-----   Message from Megan Salon, MD sent at 01/15/2018 11:21 PM EDT ----- Please let pt know her [place] was 52.  She is in menopausal range with this value.  As she is only 8 and not having any issues with the micronor, I would recommend continuing this for another year and letting me repeat the [facility] next year.  If still elevated, can stop then.  This does sometime jump around a bit at first and I don't want her to have bleeding if she does not have to have bleeding.  CMP showed kidney function test to be 0.01 avoe normal.  Ok to repeat in a year.  Other kidney function tests were normal.    Cholesterol was mildly elevated at 210 and LDLs 135.  Ok to monitor and repeat in a year.    CBC was normal and Vit D was 57.  Thanks.

## 2018-01-16 NOTE — Telephone Encounter (Signed)
LM for pt to call back.

## 2018-01-17 NOTE — Telephone Encounter (Signed)
Results sent thru [place]. Per pt request

## 2018-01-17 NOTE — Telephone Encounter (Signed)
Patient sent the following correspondence through [place]. Routing to [place] to assist patient with request.  ----- Message from [place], Generic sent at 01/16/2018 6:35 PM EDT -----    Good afternoon. I had a message from the nurse but I was not home in time to return the call. Could you post my results and/or concerns here so I can review?

## 2018-01-26 ENCOUNTER — Telehealth: Payer: Self-pay | Admitting: Obstetrics & Gynecology

## 2018-01-26 NOTE — Telephone Encounter (Signed)
Left voicemail regarding referral appointment. The information is listed below. Should the patient need to cancel or reschedule this appointment, Please advise them to call the office they've been referred to in order to reschedule.  [facility] [address] [place], [place]  Phone: [phone]  Dr. Collene Mares 02/08/18 @ 8:45 am. Please arrive 15 minutes early and bring your insurance card and photo id and list of medications

## 2018-02-04 ENCOUNTER — Other Ambulatory Visit: Payer: Self-pay | Admitting: Obstetrics & Gynecology

## 2018-02-05 NOTE — Telephone Encounter (Signed)
Medication refill request: OCP  Last AEX:  01-11-18 Next AEX: 04-25-19 Last MMG (if hormonal medication request): 07-17-17 WNL  Refill authorized: please advise

## 2018-05-23 DIAGNOSIS — K1120 Sialoadenitis, unspecified: Secondary | ICD-10-CM | POA: Diagnosis not present

## 2018-07-10 DIAGNOSIS — K219 Gastro-esophageal reflux disease without esophagitis: Secondary | ICD-10-CM | POA: Diagnosis not present

## 2018-07-10 DIAGNOSIS — J4520 Mild intermittent asthma, uncomplicated: Secondary | ICD-10-CM | POA: Diagnosis not present

## 2018-07-10 DIAGNOSIS — E039 Hypothyroidism, unspecified: Secondary | ICD-10-CM | POA: Diagnosis not present

## 2018-07-10 DIAGNOSIS — E782 Mixed hyperlipidemia: Secondary | ICD-10-CM | POA: Diagnosis not present

## 2018-07-30 DIAGNOSIS — J343 Hypertrophy of nasal turbinates: Secondary | ICD-10-CM | POA: Diagnosis not present

## 2018-07-30 DIAGNOSIS — H938X2 Other specified disorders of left ear: Secondary | ICD-10-CM | POA: Diagnosis not present

## 2018-07-30 DIAGNOSIS — K118 Other diseases of salivary glands: Secondary | ICD-10-CM | POA: Diagnosis not present

## 2018-07-30 DIAGNOSIS — L299 Pruritus, unspecified: Secondary | ICD-10-CM | POA: Diagnosis not present

## 2018-08-20 ENCOUNTER — Other Ambulatory Visit: Payer: Self-pay | Admitting: Obstetrics & Gynecology

## 2018-08-20 NOTE — Telephone Encounter (Signed)
Medication refill request: Sara Nichols AEX:  01/11/18 Next AEX: 04/25/19 Nichols MMG (if hormonal medication request): 07/17/17 Bi-rads 1 Neg Refill authorized: #[id number] with 3

## 2019-01-17 DIAGNOSIS — J4520 Mild intermittent asthma, uncomplicated: Secondary | ICD-10-CM | POA: Diagnosis not present

## 2019-01-17 DIAGNOSIS — E039 Hypothyroidism, unspecified: Secondary | ICD-10-CM | POA: Diagnosis not present

## 2019-01-17 DIAGNOSIS — E782 Mixed hyperlipidemia: Secondary | ICD-10-CM | POA: Diagnosis not present

## 2019-01-17 DIAGNOSIS — K219 Gastro-esophageal reflux disease without esophagitis: Secondary | ICD-10-CM | POA: Diagnosis not present

## 2019-02-15 ENCOUNTER — Other Ambulatory Visit: Payer: Self-pay | Admitting: Obstetrics & Gynecology

## 2019-02-15 DIAGNOSIS — Z1231 Encounter for screening mammogram for malignant neoplasm of breast: Secondary | ICD-10-CM

## 2019-03-11 DIAGNOSIS — M545 Low back pain: Secondary | ICD-10-CM | POA: Diagnosis not present

## 2019-03-11 DIAGNOSIS — M9903 Segmental and somatic dysfunction of lumbar region: Secondary | ICD-10-CM | POA: Diagnosis not present

## 2019-03-11 DIAGNOSIS — M62830 Muscle spasm of back: Secondary | ICD-10-CM | POA: Diagnosis not present

## 2019-03-11 DIAGNOSIS — M5431 Sciatica, right side: Secondary | ICD-10-CM | POA: Diagnosis not present

## 2019-03-13 DIAGNOSIS — M5431 Sciatica, right side: Secondary | ICD-10-CM | POA: Diagnosis not present

## 2019-03-13 DIAGNOSIS — M545 Low back pain: Secondary | ICD-10-CM | POA: Diagnosis not present

## 2019-03-13 DIAGNOSIS — M62830 Muscle spasm of back: Secondary | ICD-10-CM | POA: Diagnosis not present

## 2019-03-13 DIAGNOSIS — M9903 Segmental and somatic dysfunction of lumbar region: Secondary | ICD-10-CM | POA: Diagnosis not present

## 2019-03-14 DIAGNOSIS — M545 Low back pain: Secondary | ICD-10-CM | POA: Diagnosis not present

## 2019-03-14 DIAGNOSIS — M5441 Lumbago with sciatica, right side: Secondary | ICD-10-CM | POA: Diagnosis not present

## 2019-03-18 DIAGNOSIS — M545 Low back pain: Secondary | ICD-10-CM | POA: Diagnosis not present

## 2019-03-18 DIAGNOSIS — M5441 Lumbago with sciatica, right side: Secondary | ICD-10-CM | POA: Diagnosis not present

## 2019-03-19 DIAGNOSIS — M545 Low back pain: Secondary | ICD-10-CM | POA: Diagnosis not present

## 2019-03-22 DIAGNOSIS — M79661 Pain in right lower leg: Secondary | ICD-10-CM | POA: Diagnosis not present

## 2019-03-27 ENCOUNTER — Other Ambulatory Visit: Payer: Self-pay

## 2019-03-27 ENCOUNTER — Ambulatory Visit
Admission: RE | Admit: 2019-03-27 | Discharge: 2019-03-27 | Disposition: A | Discharge status: Home / Self Care | Payer: BC Managed Care – PPO | Source: Ambulatory Visit | Attending: Obstetrics & Gynecology | Admitting: Obstetrics & Gynecology

## 2019-03-27 DIAGNOSIS — Z1231 Encounter for screening mammogram for malignant neoplasm of breast: Secondary | ICD-10-CM

## 2019-04-19 DIAGNOSIS — E782 Mixed hyperlipidemia: Secondary | ICD-10-CM | POA: Diagnosis not present

## 2019-04-25 ENCOUNTER — Ambulatory Visit: Payer: BLUE CROSS/BLUE SHIELD | Admitting: Obstetrics & Gynecology

## 2019-07-04 ENCOUNTER — Other Ambulatory Visit: Payer: Self-pay

## 2019-07-08 ENCOUNTER — Encounter: Payer: Self-pay | Admitting: Obstetrics & Gynecology

## 2019-07-08 ENCOUNTER — Ambulatory Visit (INDEPENDENT_AMBULATORY_CARE_PROVIDER_SITE_OTHER): Payer: BC Managed Care – PPO | Admitting: Obstetrics & Gynecology

## 2019-07-08 ENCOUNTER — Other Ambulatory Visit: Payer: Self-pay

## 2019-07-08 VITALS — BP 144/76 | HR 88 | Temp 97.2°F | Resp 12 | Ht 60.0 in | Wt 150.0 lb

## 2019-07-08 DIAGNOSIS — Z01419 Encounter for gynecological examination (general) (routine) without abnormal findings: Secondary | ICD-10-CM

## 2019-07-08 DIAGNOSIS — N912 Amenorrhea, unspecified: Secondary | ICD-10-CM | POA: Diagnosis not present

## 2019-07-08 MED ORDER — NORETHINDRONE 0.35 MG PO TABS: 1.0000 | ORAL_TABLET | Freq: Every day | ORAL | 4 refills | Status: DC

## 2019-07-08 NOTE — Progress Notes (Signed)
53 y.o. G0P0000 Married White or Caucasian female here for annual exam.  Brought lab work from Dr. Kenton Kingfisher.  Creatinine was 1.13 and was 1.01 a year ago.  D/w pt.  She is not drinking very much water.  She will work on this. Ccholesterol was little higher this year exp LDLs which were 152.    Denies vaginal bleeding.  Patient's last menstrual period was 10/20/2016 (approximate).          Sexually active: Yes.    The current method of family planning is OCP (estrogen/progesterone).    Exercising: No.  The patient does not participate in regular exercise at present. Smoker:  no  Health Maintenance: Pap:  09/22/16 Neg. HR HPV:neg              04/17/14 Neg History of abnormal Pap:  no MMG:  03/27/19 BIRADS 1 negative/density c Colonoscopy:  She knows this is due.  Was planning to have this done this year.  Will plan to have this done next year if possible.   BMD:   never TDaP:  09/22/16 Pneumonia vaccine(s):  never Shingrix:   never Hep C testing: never Screening Labs: PCP   reports that she has never smoked. She has never used smokeless tobacco. She reports current alcohol use. She reports that she does not use drugs.  Past Medical History:  Diagnosis Date  . Asthma   . GERD (gastroesophageal reflux disease)   . Hyperlipidemia   . Hypothyroidism   . Mild vitamin D deficiency     Past Surgical History:  Procedure Laterality Date  . CARPAL TUNNEL RELEASE Bilateral 07/2016, 07/2017  . CARPAL TUNNEL RELEASE  07/2017  . TMJ ARTHROPLASTY      Current Outpatient Medications  Medication Sig Dispense Refill  . albuterol (VENTOLIN HFA) 108 (90 Base) MCG/ACT inhaler     . Budesonide-Formoterol Fumarate (SYMBICORT IN) Inhale into the lungs 2 (two) times daily.    . famotidine (PEPCID) 40 MG tablet     . levothyroxine (SYNTHROID) 75 MCG tablet Take 88 mcg by mouth daily.     . Multiple Vitamins-Minerals (MULTIVITAMIN WITH MINERALS) tablet Take 1 tablet by mouth daily.    . norethindrone  (MICRONOR,CAMILA,ERRIN) 0.35 MG tablet TAKE 1 TABLET DAILY 84 tablet 3  . valACYclovir (VALTREX) 1000 MG tablet Take 1 tablet (1,000 mg total) by mouth daily as needed. 30 tablet 1   No current facility-administered medications for this visit.     Family History  Problem Relation Age of Onset  . Thyroid disease Mother   . Asthma Father        uses inhaler  . Cancer Father        prostate & blood  . Hypertension Sister   . Breast cancer Paternal Grandmother   . Colon cancer Maternal Grandfather 92    Review of Systems  All other systems reviewed and are negative.   Exam:   BP (!) 144/76 (BP Location: Right Arm, Patient Position: Sitting, Cuff Size: Normal)   Pulse 88   Temp (!) 97.2 F (36.2 C) (Temporal)   Resp 12   Ht 5' (1.524 m)   Wt 150 lb (68 kg)   LMP 10/20/2016 (Approximate)   BMI 29.29 kg/m   Height: 5' (152.4 cm)  Ht Readings from Last 3 Encounters:  07/08/19 5' (1.524 m)  01/11/18 5' 0.5" (1.537 m)  09/22/16 5' 0.25" (1.53 m)    General appearance: alert, cooperative and appears stated age Head: Normocephalic, without  obvious abnormality, atraumatic Neck: no adenopathy, supple, symmetrical, trachea midline and thyroid normal to inspection and palpation Lungs: clear to auscultation bilaterally Breasts: normal appearance, no masses or tenderness Heart: regular rate and rhythm Abdomen: soft, non-tender; bowel sounds normal; no masses,  no organomegaly Extremities: extremities normal, atraumatic, no cyanosis or edema Skin: Skin color, texture, turgor normal. No rashes or lesions Lymph nodes: Cervical, supraclavicular, and axillary nodes normal. No abnormal inguinal nodes palpated Neurologic: Grossly normal   Pelvic: External genitalia:  no lesions              Urethra:  normal appearing urethra with no masses, tenderness or lesions              Bartholins and Skenes: normal                 Vagina: normal appearing vagina with normal color and discharge,  no lesions              Cervix: no lesions              Pap taken:  Not obtained Bimanual Exam:  Uterus:  normal size, contour, position, consistency, mobility, non-tender              Adnexa: normal adnexa and no mass, fullness, tenderness               Rectovaginal: Confirms               Anus:  normal sphincter tone, no lesions  Chaperone was present for exam.  A:  Well Woman with normal exam Asthma H/o elevated lipids H/o elevated liver enzymes that have normalized Mildly elevated creatinine  Family hx of breast cancer in her MGM H/o oral HSV  P:   Mammogram guidelines reviewed.  Doing 3D. pap smear with neg HR HPV 2018.  Not indicated today. Repeat [place] today RF micronor daily.  #90/4RF Colonoscopy is due.  Pt is aware.   Shingrix reviewed today. Return 1 year or prn

## 2019-07-09 LAB — FOLLICLE STIMULATING HORMONE: FSH: 51.1 m[IU]/mL

## 2019-07-20 ENCOUNTER — Other Ambulatory Visit: Payer: Self-pay | Admitting: Obstetrics & Gynecology

## 2019-08-14 DIAGNOSIS — M5431 Sciatica, right side: Secondary | ICD-10-CM | POA: Diagnosis not present

## 2019-10-08 DIAGNOSIS — M25551 Pain in right hip: Secondary | ICD-10-CM | POA: Diagnosis not present

## 2019-10-14 DIAGNOSIS — M5416 Radiculopathy, lumbar region: Secondary | ICD-10-CM | POA: Diagnosis not present

## 2019-10-16 DIAGNOSIS — M5416 Radiculopathy, lumbar region: Secondary | ICD-10-CM | POA: Diagnosis not present

## 2019-10-21 DIAGNOSIS — M5416 Radiculopathy, lumbar region: Secondary | ICD-10-CM | POA: Diagnosis not present

## 2019-10-22 DIAGNOSIS — M5416 Radiculopathy, lumbar region: Secondary | ICD-10-CM | POA: Diagnosis not present

## 2019-10-22 DIAGNOSIS — M545 Low back pain: Secondary | ICD-10-CM | POA: Diagnosis not present

## 2019-10-22 DIAGNOSIS — M25551 Pain in right hip: Secondary | ICD-10-CM | POA: Diagnosis not present

## 2019-10-22 DIAGNOSIS — M5441 Lumbago with sciatica, right side: Secondary | ICD-10-CM | POA: Diagnosis not present

## 2019-10-28 DIAGNOSIS — M545 Low back pain: Secondary | ICD-10-CM | POA: Diagnosis not present

## 2019-10-28 DIAGNOSIS — M25551 Pain in right hip: Secondary | ICD-10-CM | POA: Diagnosis not present

## 2019-10-31 DIAGNOSIS — M4722 Other spondylosis with radiculopathy, cervical region: Secondary | ICD-10-CM | POA: Diagnosis not present

## 2019-10-31 DIAGNOSIS — M5441 Lumbago with sciatica, right side: Secondary | ICD-10-CM | POA: Diagnosis not present

## 2019-11-01 DIAGNOSIS — M5416 Radiculopathy, lumbar region: Secondary | ICD-10-CM | POA: Diagnosis not present

## 2019-11-04 ENCOUNTER — Other Ambulatory Visit: Payer: Self-pay | Admitting: Orthopedic Surgery

## 2019-11-04 NOTE — Pre-Procedure Instructions (Addendum)
MUNTAS RECKTENWALD  11/04/2019    Your procedure is scheduled on Thursday, March 18.  Report to [facility], Main Entrance or Entrance "A" at 8:30 AM                  Your surgery or procedure is scheduled for 11:30 AM   Call this number if you have problems the morning of surgery: [phone]  This is the number for the Pre- Surgical Desk.                     For any other questions, please call [phone], Monday - Friday 8 AM - 4 PM.   Remember:  Do not eat  after midnight, Wednesday, March 17th.  You may drink clear liquids until 8:30 AM .  Clear liquids allowed are:       Water, Juice (non-citric and without pulp), Carbonated beverages, Clear Tea, Black Coffee only, Plain Jell-O only, Gatorade and Plain Popsicles only        Drink the Pre- Surgery Ensure between 7:45 AM and 8:30 AM              After 8:30 AM do not drink any thing else.  Take these medicines the morning of surgery with A SIP OF WATER :  levothyroxine (SYNTHROID)             Use budesonide-formoterol (SYMBICORT)                              May take HYDROcodone-acetaminophen (NORCO/VICODIN) if needed.  STOP/Do Not Start taking Aspirin, Aspirin Products (Goody Powder, Excedrin Migraine), Ibuprofen (Advil), Naproxen (Aleve), Vitamins and Herbal Products (ie Fish Oil).  [place]- Preparing For Surgery  Before surgery, you can play an important role. Because skin is not sterile, your skin needs to be as free of germs as possible. You can reduce the number of germs on your skin by washing with CHG (chlorahexidine gluconate) Soap before surgery.  CHG is an antiseptic cleaner which kills germs and bonds with the skin to continue killing germs even after washing.    Oral Hygiene is also important to reduce your risk of infection.  Remember - BRUSH YOUR TEETH THE MORNING OF SURGERY WITH YOUR REGULAR TOOTHPASTE  Please do not use if you have an allergy to CHG or antibacterial soaps. If your skin becomes  reddened/irritated stop using the CHG.  Do not shave (including legs and underarms) for at least 48 hours prior to first CHG shower. It is OK to shave your face.  Please follow these instructions carefully.   1. Shower the NIGHT BEFORE SURGERY and the MORNING OF SURGERY with CHG.   2. If you chose to wash your hair, wash your hair first as usual with your normal shampoo.  3. After you shampoo, wash your face and private area with the soap you use at home, then rinse your hair and body thoroughly to remove the shampoo and soap.  4. Use CHG as you would any other liquid soap. You can apply CHG directly to the skin and wash gently with a scrungie or a clean washcloth.   Apply the CHG Soap to your body ONLY FROM THE NECK DOWN.  Do not use on open wounds or open sores. Avoid contact with your eyes, ears, mouth and genitals (private parts). 5. Wash thoroughly, paying special attention to the area where your surgery  will be performed.  6. Thoroughly rinse your body with warm water from the neck down.  7. DO NOT shower/wash with your normal soap after using and rinsing off the CHG Soap.  8. Pat yourself dry with a CLEAN TOWEL.  9. Wear CLEAN PAJAMAS to bed the night before surgery, wear comfortable clothes the morning of surgery  10. Place CLEAN SHEETS on your bed the night of your first shower and DO NOT SLEEP WITH PETS.  Day of Surgery: Shower as instructed above. Do not wear lotions, powders, or perfumes, or deodorant. Please wear clean clothes to the hospital/surgery center.   Remember to brush your teeth WITH YOUR REGULAR TOOTHPASTE. Do not wear jewelry, make-up or nail polish.  Do not wear lotions, powders, or perfumes, or deodorant.  Do not shave 48 hours prior to surgery.  Men may shave face and neck.  Do not bring valuables to the hospital.  [facility] is not responsible for any belongings or valuables.  Contacts, dentures or bridgework may not be worn into surgery.  Leave your  suitcase in the car.  After surgery it may be brought to your room.  For patients admitted to the hospital, discharge time will be determined by your treatment team.  Patients discharged the day of surgery will not be allowed to drive home.   Please read over the following fact sheets that you were given:  Pain Booklet,  Incentive Spirometry and Surgical Site Infections

## 2019-11-05 ENCOUNTER — Other Ambulatory Visit: Payer: Self-pay

## 2019-11-05 ENCOUNTER — Encounter (HOSPITAL_COMMUNITY)
Admission: RE | Admit: 2019-11-05 | Discharge: 2019-11-05 | Disposition: A | Discharge status: Home / Self Care | Payer: BC Managed Care – PPO | Source: Ambulatory Visit | Attending: Orthopedic Surgery | Admitting: Orthopedic Surgery

## 2019-11-05 ENCOUNTER — Encounter (HOSPITAL_COMMUNITY): Payer: Self-pay

## 2019-11-05 ENCOUNTER — Other Ambulatory Visit (HOSPITAL_COMMUNITY)
Admission: RE | Admit: 2019-11-05 | Discharge: 2019-11-05 | Disposition: A | Discharge status: Home / Self Care | Payer: BC Managed Care – PPO | Source: Ambulatory Visit | Attending: Orthopedic Surgery | Admitting: Orthopedic Surgery

## 2019-11-05 DIAGNOSIS — Z01812 Encounter for preprocedural laboratory examination: Secondary | ICD-10-CM | POA: Diagnosis not present

## 2019-11-05 DIAGNOSIS — Z20822 Contact with and (suspected) exposure to covid-19: Secondary | ICD-10-CM | POA: Insufficient documentation

## 2019-11-05 HISTORY — DX: Dyspnea, unspecified: R06.00

## 2019-11-05 LAB — PROTIME-INR
INR: 1 (ref 0.8–1.2)
Prothrombin Time: 13.1 seconds (ref 11.4–15.2)

## 2019-11-05 LAB — COMPREHENSIVE METABOLIC PANEL
ALT: 98 U/L — ABNORMAL HIGH (ref 0–44)
AST: 34 U/L (ref 15–41)
Albumin: 4.4 g/dL (ref 3.5–5.0)
Alkaline Phosphatase: 61 U/L (ref 38–126)
Anion gap: 13 (ref 5–15)
BUN: 17 mg/dL (ref 6–20)
CO2: 21 mmol/L — ABNORMAL LOW (ref 22–32)
Calcium: 9.6 mg/dL (ref 8.9–10.3)
Chloride: 103 mmol/L (ref 98–111)
Creatinine, Ser: 1.19 mg/dL — ABNORMAL HIGH (ref 0.44–1.00)
GFR calc Af Amer: >60 mL/min (ref >60)
GFR calc non Af Amer: 53 mL/min — ABNORMAL LOW (ref >60)
Glucose, Bld: 109 mg/dL — ABNORMAL HIGH (ref 70–99)
Potassium: 4 mmol/L (ref 3.5–5.1)
Sodium: 137 mmol/L (ref 135–145)
Total Bilirubin: 0.8 mg/dL (ref 0.3–1.2)
Total Protein: 7.6 g/dL (ref 6.5–8.1)

## 2019-11-05 LAB — CBC WITH DIFFERENTIAL/PLATELET
Abs Immature Granulocytes: 0.02 10*3/uL (ref 0.00–0.07)
Basophils Absolute: 0.1 10*3/uL (ref 0.0–0.1)
Basophils Relative: 2 %
Eosinophils Absolute: 0.5 10*3/uL (ref 0.0–0.5)
Eosinophils Relative: 8 %
HCT: 42.1 % (ref 36.0–46.0)
Hemoglobin: 14 g/dL (ref 12.0–15.0)
Immature Granulocytes: 0 %
Lymphocytes Relative: 25 %
Lymphs Abs: 1.5 10*3/uL (ref 0.7–4.0)
MCH: 30.2 pg (ref 26.0–34.0)
MCHC: 33.3 g/dL (ref 30.0–36.0)
MCV: 90.9 fL (ref 80.0–100.0)
Monocytes Absolute: 0.7 10*3/uL (ref 0.1–1.0)
Monocytes Relative: 11 %
Neutro Abs: 3.4 10*3/uL (ref 1.7–7.7)
Neutrophils Relative %: 54 %
Platelets: 379 10*3/uL (ref 150–400)
RBC: 4.63 MIL/uL (ref 3.87–5.11)
RDW: 13.8 % (ref 11.5–15.5)
WBC: 6.1 10*3/uL (ref 4.0–10.5)
nRBC: 0 % (ref 0.0–0.2)

## 2019-11-05 LAB — URINALYSIS, ROUTINE W REFLEX MICROSCOPIC
Bacteria, UA: NONE SEEN
Bilirubin Urine: NEGATIVE
Glucose, UA: NEGATIVE mg/dL
Ketones, ur: NEGATIVE mg/dL
Nitrite: NEGATIVE
Protein, ur: NEGATIVE mg/dL
Specific Gravity, Urine: 1.021 (ref 1.005–1.030)
pH: 5 (ref 5.0–8.0)

## 2019-11-05 LAB — TYPE AND SCREEN
ABO/RH(D): O POS
Antibody Screen: NEGATIVE

## 2019-11-05 LAB — ABO/RH: ABO/RH(D): O POS

## 2019-11-05 LAB — SARS CORONAVIRUS 2 (TAT 6-24 HRS): SARS Coronavirus 2: NEGATIVE

## 2019-11-05 LAB — SURGICAL PCR SCREEN
MRSA, PCR: NEGATIVE
Staphylococcus aureus: NEGATIVE

## 2019-11-05 LAB — APTT: aPTT: 29 seconds (ref 24–36)

## 2019-11-05 NOTE — Progress Notes (Addendum)
PCP - Dr. Shirline Frees  Cardiologist - no  Chest x-ray - na  EKG - na  Stress Test - no  ECHO - no  Cardiac Cath - no  Sleep Study - no CPAP - no   LABS-CBC, PT/PTT CMP, T/S, UA, PCR  ASA-no  ERAS-yes- clears until 0830 Pre- surgery beverage given  HA1C-na Fasting Blood Sugar - na Checks Blood Sugar __0___ times a day  Anesthesia- Sara Nichols reports that she has not had a period in 3 years, patient is on birth control pill daily, her Dr. Geraldine Nichols her to come off BC pill, patient does not want to, Patient said labs indicate she is menopausal. I spoke with Sara Cass, NP she said to get a urine.  Pt denies having chest pain, sob, or fever at this time. All instructions explained to the pt, with a verbal understanding of the material. Pt agrees to go over the instructions while at home for a better understanding. Pt also instructed to self quarantine after being tested for COVID-19. The opportunity to ask questions was provided.

## 2019-11-07 ENCOUNTER — Inpatient Hospital Stay (HOSPITAL_COMMUNITY): Payer: BC Managed Care – PPO

## 2019-11-07 ENCOUNTER — Inpatient Hospital Stay (HOSPITAL_COMMUNITY): Payer: BC Managed Care – PPO | Admitting: Certified Registered Nurse Anesthetist

## 2019-11-07 ENCOUNTER — Inpatient Hospital Stay (HOSPITAL_COMMUNITY)
Admission: RE | Admit: 2019-11-07 | Discharge: 2019-11-09 | DRG: 455 | Disposition: A | Discharge status: Home Health | Payer: BC Managed Care – PPO | Source: Ambulatory Visit | Attending: Orthopedic Surgery | Admitting: Orthopedic Surgery

## 2019-11-07 ENCOUNTER — Encounter (HOSPITAL_COMMUNITY)
Admission: RE | Disposition: A | Discharge status: Home Health | Payer: Self-pay | Source: Ambulatory Visit | Attending: Orthopedic Surgery

## 2019-11-07 ENCOUNTER — Other Ambulatory Visit: Payer: Self-pay

## 2019-11-07 ENCOUNTER — Encounter (HOSPITAL_COMMUNITY): Payer: Self-pay | Admitting: Orthopedic Surgery

## 2019-11-07 DIAGNOSIS — E785 Hyperlipidemia, unspecified: Secondary | ICD-10-CM | POA: Diagnosis present

## 2019-11-07 DIAGNOSIS — G96198 Other disorders of meninges, not elsewhere classified: Secondary | ICD-10-CM | POA: Diagnosis not present

## 2019-11-07 DIAGNOSIS — M5410 Radiculopathy, site unspecified: Secondary | ICD-10-CM | POA: Diagnosis not present

## 2019-11-07 DIAGNOSIS — M5386 Other specified dorsopathies, lumbar region: Secondary | ICD-10-CM | POA: Diagnosis not present

## 2019-11-07 DIAGNOSIS — M5416 Radiculopathy, lumbar region: Principal | ICD-10-CM | POA: Diagnosis present

## 2019-11-07 DIAGNOSIS — Z79890 Hormone replacement therapy (postmenopausal): Secondary | ICD-10-CM

## 2019-11-07 DIAGNOSIS — M4326 Fusion of spine, lumbar region: Secondary | ICD-10-CM | POA: Diagnosis not present

## 2019-11-07 DIAGNOSIS — Z981 Arthrodesis status: Secondary | ICD-10-CM | POA: Diagnosis not present

## 2019-11-07 DIAGNOSIS — Z7951 Long term (current) use of inhaled steroids: Secondary | ICD-10-CM | POA: Diagnosis not present

## 2019-11-07 DIAGNOSIS — M4316 Spondylolisthesis, lumbar region: Secondary | ICD-10-CM | POA: Diagnosis not present

## 2019-11-07 DIAGNOSIS — E039 Hypothyroidism, unspecified: Secondary | ICD-10-CM | POA: Diagnosis present

## 2019-11-07 DIAGNOSIS — Z800 Family history of malignant neoplasm of digestive organs: Secondary | ICD-10-CM | POA: Diagnosis not present

## 2019-11-07 DIAGNOSIS — Z825 Family history of asthma and other chronic lower respiratory diseases: Secondary | ICD-10-CM | POA: Diagnosis not present

## 2019-11-07 DIAGNOSIS — M79604 Pain in right leg: Secondary | ICD-10-CM | POA: Diagnosis present

## 2019-11-07 DIAGNOSIS — K219 Gastro-esophageal reflux disease without esophagitis: Secondary | ICD-10-CM | POA: Diagnosis not present

## 2019-11-07 DIAGNOSIS — E559 Vitamin D deficiency, unspecified: Secondary | ICD-10-CM | POA: Diagnosis not present

## 2019-11-07 DIAGNOSIS — Z8249 Family history of ischemic heart disease and other diseases of the circulatory system: Secondary | ICD-10-CM | POA: Diagnosis not present

## 2019-11-07 DIAGNOSIS — J45909 Unspecified asthma, uncomplicated: Secondary | ICD-10-CM | POA: Diagnosis present

## 2019-11-07 DIAGNOSIS — Z8349 Family history of other endocrine, nutritional and metabolic diseases: Secondary | ICD-10-CM

## 2019-11-07 DIAGNOSIS — Z803 Family history of malignant neoplasm of breast: Secondary | ICD-10-CM | POA: Diagnosis not present

## 2019-11-07 DIAGNOSIS — M62838 Other muscle spasm: Secondary | ICD-10-CM | POA: Diagnosis not present

## 2019-11-07 DIAGNOSIS — Z419 Encounter for procedure for purposes other than remedying health state, unspecified: Secondary | ICD-10-CM

## 2019-11-07 DIAGNOSIS — M7138 Other bursal cyst, other site: Secondary | ICD-10-CM | POA: Diagnosis not present

## 2019-11-07 HISTORY — PX: TRANSFORAMINAL LUMBAR INTERBODY FUSION (TLIF) WITH PEDICLE SCREW FIXATION 1 LEVEL: SHX6141

## 2019-11-07 LAB — POCT PREGNANCY, URINE: Preg Test, Ur: NEGATIVE

## 2019-11-07 SURGERY — TRANSFORAMINAL LUMBAR INTERBODY FUSION (TLIF) WITH PEDICLE SCREW FIXATION 1 LEVEL
Anesthesia: General | Site: Back | Laterality: Right

## 2019-11-07 MED ORDER — LEVOTHYROXINE SODIUM 75 MCG PO TABS
75.0000 ug | ORAL_TABLET | Freq: Every day | ORAL | Status: DC
  Administered 2019-11-08 – 2019-11-09 (×2): 75 ug via ORAL
  Filled 2019-11-07 (×2): qty 1

## 2019-11-07 MED ORDER — HYDROCODONE-ACETAMINOPHEN 7.5-325 MG PO TABS
ORAL_TABLET | ORAL | Status: AC
  Filled 2019-11-07: qty 1

## 2019-11-07 MED ORDER — SODIUM CHLORIDE 0.9% FLUSH
3.0000 mL | Freq: Two times a day (BID) | INTRAVENOUS | Status: DC
  Administered 2019-11-07 – 2019-11-08 (×2): 3 mL via INTRAVENOUS

## 2019-11-07 MED ORDER — ACETAMINOPHEN 10 MG/ML IV SOLN
INTRAVENOUS | Status: DC | PRN
  Administered 2019-11-07: 1000 mg via INTRAVENOUS

## 2019-11-07 MED ORDER — BUPIVACAINE LIPOSOME 1.3 % IJ SUSP
20.0000 mL | Freq: Once | INTRAMUSCULAR | Status: DC
  Filled 2019-11-07: qty 20

## 2019-11-07 MED ORDER — HYDROMORPHONE HCL 1 MG/ML IJ SOLN
INTRAMUSCULAR | Status: AC
  Filled 2019-11-07: qty 1

## 2019-11-07 MED ORDER — ROCURONIUM BROMIDE 10 MG/ML (PF) SYRINGE
PREFILLED_SYRINGE | INTRAVENOUS | Status: DC | PRN
  Administered 2019-11-07: 40 mg via INTRAVENOUS
  Administered 2019-11-07: 30 mg via INTRAVENOUS
  Administered 2019-11-07: 20 mg via INTRAVENOUS

## 2019-11-07 MED ORDER — OXYCODONE-ACETAMINOPHEN 5-325 MG PO TABS
1.0000 | ORAL_TABLET | ORAL | Status: DC | PRN
  Administered 2019-11-07 – 2019-11-09 (×8): 2 via ORAL
  Filled 2019-11-07 (×8): qty 2

## 2019-11-07 MED ORDER — BUPIVACAINE HCL (PF) 0.25 % IJ SOLN
INTRAMUSCULAR | Status: DC | PRN
  Administered 2019-11-07: 30 mL

## 2019-11-07 MED ORDER — METHOCARBAMOL 1000 MG/10ML IJ SOLN
500.0000 mg | Freq: Four times a day (QID) | INTRAVENOUS | Status: DC | PRN
  Filled 2019-11-07: qty 5

## 2019-11-07 MED ORDER — FAMOTIDINE 20 MG PO TABS
40.0000 mg | ORAL_TABLET | Freq: Every day | ORAL | Status: DC
  Administered 2019-11-08 – 2019-11-09 (×2): 40 mg via ORAL
  Filled 2019-11-07 (×2): qty 2

## 2019-11-07 MED ORDER — NORETHINDRONE 0.35 MG PO TABS: 1.0000 | ORAL_TABLET | Freq: Every day | ORAL | Status: DC

## 2019-11-07 MED ORDER — ONDANSETRON HCL 4 MG/2ML IJ SOLN
INTRAMUSCULAR | Status: AC
  Filled 2019-11-07: qty 2

## 2019-11-07 MED ORDER — MORPHINE SULFATE (PF) 2 MG/ML IV SOLN
1.0000 mg | INTRAVENOUS | Status: DC | PRN
  Administered 2019-11-08: 2 mg via INTRAVENOUS
  Filled 2019-11-07: qty 1

## 2019-11-07 MED ORDER — FENTANYL CITRATE (PF) 250 MCG/5ML IJ SOLN
INTRAMUSCULAR | Status: AC
  Filled 2019-11-07: qty 5

## 2019-11-07 MED ORDER — KETAMINE HCL 50 MG/5ML IJ SOSY
PREFILLED_SYRINGE | INTRAMUSCULAR | Status: AC
  Filled 2019-11-07: qty 5

## 2019-11-07 MED ORDER — BUPIVACAINE HCL (PF) 0.25 % IJ SOLN
INTRAMUSCULAR | Status: AC
  Filled 2019-11-07: qty 30

## 2019-11-07 MED ORDER — FENTANYL CITRATE (PF) 250 MCG/5ML IJ SOLN
INTRAMUSCULAR | Status: DC | PRN
  Administered 2019-11-07: 100 ug via INTRAVENOUS
  Administered 2019-11-07 (×2): 50 ug via INTRAVENOUS
  Administered 2019-11-07: 25 ug via INTRAVENOUS
  Administered 2019-11-07 (×2): 50 ug via INTRAVENOUS

## 2019-11-07 MED ORDER — PHENOL 1.4 % MT LIQD: 1.0000 | OROMUCOSAL | Status: DC | PRN

## 2019-11-07 MED ORDER — THROMBIN 20000 UNITS EX SOLR
CUTANEOUS | Status: DC | PRN
  Administered 2019-11-07: 20000 [IU] via TOPICAL

## 2019-11-07 MED ORDER — DEXAMETHASONE SODIUM PHOSPHATE 10 MG/ML IJ SOLN
INTRAMUSCULAR | Status: AC
  Filled 2019-11-07: qty 1

## 2019-11-07 MED ORDER — 0.9 % SODIUM CHLORIDE (POUR BTL) OPTIME
TOPICAL | Status: DC | PRN
  Administered 2019-11-07: 1000 mL

## 2019-11-07 MED ORDER — ALUM & MAG HYDROXIDE-SIMETH 200-200-20 MG/5ML PO SUSP: 30.0000 mL | Freq: Four times a day (QID) | ORAL | Status: DC | PRN

## 2019-11-07 MED ORDER — SODIUM CHLORIDE 0.9 % IV SOLN: 250.0000 mL | INTRAVENOUS | Status: DC

## 2019-11-07 MED ORDER — BUPIVACAINE-EPINEPHRINE 0.25% -1:200000 IJ SOLN: INTRAMUSCULAR | Status: DC | PRN

## 2019-11-07 MED ORDER — ONDANSETRON HCL 4 MG/2ML IJ SOLN: 4.0000 mg | Freq: Four times a day (QID) | INTRAMUSCULAR | Status: DC | PRN

## 2019-11-07 MED ORDER — SODIUM CHLORIDE 0.9% FLUSH: 3.0000 mL | INTRAVENOUS | Status: DC | PRN

## 2019-11-07 MED ORDER — ONDANSETRON HCL 4 MG PO TABS: 4.0000 mg | ORAL_TABLET | Freq: Four times a day (QID) | ORAL | Status: DC | PRN

## 2019-11-07 MED ORDER — DOCUSATE SODIUM 100 MG PO CAPS
100.0000 mg | ORAL_CAPSULE | Freq: Two times a day (BID) | ORAL | Status: DC
  Administered 2019-11-07 – 2019-11-09 (×4): 100 mg via ORAL
  Filled 2019-11-07 (×4): qty 1

## 2019-11-07 MED ORDER — POVIDONE-IODINE 7.5 % EX SOLN: Freq: Once | CUTANEOUS | Status: DC

## 2019-11-07 MED ORDER — HYDROMORPHONE HCL 1 MG/ML IJ SOLN
0.2500 mg | INTRAMUSCULAR | Status: DC | PRN
  Administered 2019-11-07: 0.25 mg via INTRAVENOUS
  Administered 2019-11-07 (×2): 0.5 mg via INTRAVENOUS
  Administered 2019-11-07: 0.25 mg via INTRAVENOUS

## 2019-11-07 MED ORDER — SENNOSIDES-DOCUSATE SODIUM 8.6-50 MG PO TABS: 1.0000 | ORAL_TABLET | Freq: Every evening | ORAL | Status: DC | PRN

## 2019-11-07 MED ORDER — CEFAZOLIN SODIUM-DEXTROSE 2-4 GM/100ML-% IV SOLN
INTRAVENOUS | Status: AC
  Filled 2019-11-07: qty 100

## 2019-11-07 MED ORDER — EPINEPHRINE PF 1 MG/ML IJ SOLN
INTRAMUSCULAR | Status: AC
  Filled 2019-11-07: qty 1

## 2019-11-07 MED ORDER — METHYLENE BLUE 0.5 % INJ SOLN
INTRAVENOUS | Status: DC | PRN
  Administered 2019-11-07: 1 mL via SUBMUCOSAL

## 2019-11-07 MED ORDER — PROPOFOL 10 MG/ML IV BOLUS
INTRAVENOUS | Status: AC
  Filled 2019-11-07: qty 20

## 2019-11-07 MED ORDER — PROPOFOL 1000 MG/100ML IV EMUL
INTRAVENOUS | Status: AC
  Filled 2019-11-07: qty 300

## 2019-11-07 MED ORDER — POTASSIUM CHLORIDE IN NACL 20-0.9 MEQ/L-% IV SOLN: INTRAVENOUS | Status: DC

## 2019-11-07 MED ORDER — MENTHOL 3 MG MT LOZG: 1.0000 | LOZENGE | OROMUCOSAL | Status: DC | PRN

## 2019-11-07 MED ORDER — METHYLENE BLUE 0.5 % INJ SOLN
INTRAVENOUS | Status: AC
  Filled 2019-11-07: qty 10

## 2019-11-07 MED ORDER — PROPOFOL 10 MG/ML IV BOLUS
INTRAVENOUS | Status: DC | PRN
  Administered 2019-11-07: 130 mg via INTRAVENOUS

## 2019-11-07 MED ORDER — LIDOCAINE 2% (20 MG/ML) 5 ML SYRINGE
INTRAMUSCULAR | Status: AC
  Filled 2019-11-07: qty 5

## 2019-11-07 MED ORDER — MEPERIDINE HCL 25 MG/ML IJ SOLN: 6.2500 mg | INTRAMUSCULAR | Status: DC | PRN

## 2019-11-07 MED ORDER — FLEET ENEMA 7-19 GM/118ML RE ENEM: 1.0000 | ENEMA | Freq: Once | RECTAL | Status: DC | PRN

## 2019-11-07 MED ORDER — CEFAZOLIN SODIUM-DEXTROSE 2-4 GM/100ML-% IV SOLN
2.0000 g | INTRAVENOUS | Status: AC
  Administered 2019-11-07: 2 g via INTRAVENOUS

## 2019-11-07 MED ORDER — BISACODYL 5 MG PO TBEC: 5.0000 mg | DELAYED_RELEASE_TABLET | Freq: Every day | ORAL | Status: DC | PRN

## 2019-11-07 MED ORDER — THROMBIN 20000 UNITS EX SOLR
CUTANEOUS | Status: AC
  Filled 2019-11-07: qty 20000

## 2019-11-07 MED ORDER — HEMOSTATIC AGENTS (NO CHARGE) OPTIME
TOPICAL | Status: DC | PRN
  Administered 2019-11-07: 1 via TOPICAL

## 2019-11-07 MED ORDER — PHENYLEPHRINE HCL-NACL 10-0.9 MG/250ML-% IV SOLN
INTRAVENOUS | Status: DC | PRN
  Administered 2019-11-07: 20 ug/min via INTRAVENOUS

## 2019-11-07 MED ORDER — ACETAMINOPHEN 650 MG RE SUPP: 650.0000 mg | RECTAL | Status: DC | PRN

## 2019-11-07 MED ORDER — ONDANSETRON HCL 4 MG/2ML IJ SOLN
INTRAMUSCULAR | Status: DC | PRN
  Administered 2019-11-07: 4 mg via INTRAVENOUS

## 2019-11-07 MED ORDER — LIDOCAINE HCL (CARDIAC) PF 100 MG/5ML IV SOSY
PREFILLED_SYRINGE | INTRAVENOUS | Status: DC | PRN
  Administered 2019-11-07: 100 mg via INTRAVENOUS

## 2019-11-07 MED ORDER — DEXAMETHASONE SODIUM PHOSPHATE 10 MG/ML IJ SOLN
INTRAMUSCULAR | Status: DC | PRN
  Administered 2019-11-07: 5 mg via INTRAVENOUS

## 2019-11-07 MED ORDER — HYDROCODONE-ACETAMINOPHEN 7.5-325 MG PO TABS
1.0000 | ORAL_TABLET | Freq: Once | ORAL | Status: AC | PRN
  Administered 2019-11-07: 1 via ORAL

## 2019-11-07 MED ORDER — PROMETHAZINE HCL 25 MG/ML IJ SOLN: 6.2500 mg | INTRAMUSCULAR | Status: DC | PRN

## 2019-11-07 MED ORDER — METHOCARBAMOL 500 MG PO TABS
500.0000 mg | ORAL_TABLET | Freq: Four times a day (QID) | ORAL | Status: DC | PRN
  Administered 2019-11-07 – 2019-11-09 (×6): 500 mg via ORAL
  Filled 2019-11-07 (×5): qty 1

## 2019-11-07 MED ORDER — MIDAZOLAM HCL 2 MG/2ML IJ SOLN
INTRAMUSCULAR | Status: DC | PRN
  Administered 2019-11-07: 2 mg via INTRAVENOUS

## 2019-11-07 MED ORDER — ACETAMINOPHEN 325 MG PO TABS: 650.0000 mg | ORAL_TABLET | ORAL | Status: DC | PRN

## 2019-11-07 MED ORDER — MIDAZOLAM HCL 2 MG/2ML IJ SOLN
INTRAMUSCULAR | Status: AC
  Filled 2019-11-07: qty 2

## 2019-11-07 MED ORDER — ACETAMINOPHEN 10 MG/ML IV SOLN
INTRAVENOUS | Status: AC
  Filled 2019-11-07: qty 100

## 2019-11-07 MED ORDER — ZOLPIDEM TARTRATE 5 MG PO TABS: 5.0000 mg | ORAL_TABLET | Freq: Every evening | ORAL | Status: DC | PRN

## 2019-11-07 MED ORDER — SUGAMMADEX SODIUM 200 MG/2ML IV SOLN
INTRAVENOUS | Status: DC | PRN
  Administered 2019-11-07: 140 mg via INTRAVENOUS

## 2019-11-07 MED ORDER — ACETAMINOPHEN 10 MG/ML IV SOLN: 1000.0000 mg | Freq: Once | INTRAVENOUS | Status: DC | PRN

## 2019-11-07 MED ORDER — FLUTICASONE FUROATE-VILANTEROL 200-25 MCG/INH IN AEPB
1.0000 | INHALATION_SPRAY | Freq: Every day | RESPIRATORY_TRACT | Status: DC
  Administered 2019-11-08: 1 via RESPIRATORY_TRACT
  Filled 2019-11-07 (×2): qty 28

## 2019-11-07 MED ORDER — PROPOFOL 500 MG/50ML IV EMUL
INTRAVENOUS | Status: DC | PRN
  Administered 2019-11-07: 130 ug/kg/min via INTRAVENOUS
  Administered 2019-11-07: 150 ug/kg/min via INTRAVENOUS

## 2019-11-07 MED ORDER — KETAMINE HCL 10 MG/ML IJ SOLN
INTRAMUSCULAR | Status: DC | PRN
  Administered 2019-11-07 (×3): 10 mg via INTRAVENOUS

## 2019-11-07 MED ORDER — BUPIVACAINE LIPOSOME 1.3 % IJ SUSP
INTRAMUSCULAR | Status: DC | PRN
  Administered 2019-11-07: 20 mL

## 2019-11-07 MED ORDER — METHOCARBAMOL 500 MG PO TABS
ORAL_TABLET | ORAL | Status: AC
  Filled 2019-11-07: qty 1

## 2019-11-07 MED ORDER — ROCURONIUM BROMIDE 10 MG/ML (PF) SYRINGE
PREFILLED_SYRINGE | INTRAVENOUS | Status: AC
  Filled 2019-11-07: qty 10

## 2019-11-07 MED ORDER — CEFAZOLIN SODIUM-DEXTROSE 2-4 GM/100ML-% IV SOLN
2.0000 g | Freq: Three times a day (TID) | INTRAVENOUS | Status: AC
  Administered 2019-11-07 – 2019-11-08 (×2): 2 g via INTRAVENOUS
  Filled 2019-11-07 (×2): qty 100

## 2019-11-07 MED ORDER — LACTATED RINGERS IV SOLN: INTRAVENOUS | Status: DC

## 2019-11-07 SURGICAL SUPPLY — 90 items
BENZOIN TINCTURE PRP APPL 2/3 (GAUZE/BANDAGES/DRESSINGS) IMPLANT
BLADE CLIPPER SURG (BLADE) IMPLANT
BONE VIVIGEN FORMABLE 5.4CC (Bone Implant) ×3 IMPLANT
BUR PRESCISION 1.7 ELITE (BURR) ×3 IMPLANT
BUR ROUND FLUTED 5 RND (BURR) ×2 IMPLANT
BUR ROUND FLUTED 5MM RND (BURR) ×1
BUR ROUND PRECISION 4.0 (BURR) IMPLANT
BUR ROUND PRECISION 4.0MM (BURR)
BUR SABER RD CUTTING 3.0 (BURR) IMPLANT
BUR SABER RD CUTTING 3.0MM (BURR)
CAGE CONCORDE BULLET 11X12X27 (Cage) ×3 IMPLANT
CAGE SPNL 5D BLT NOSE 27X11X12 (Cage) ×1 IMPLANT
CARTRIDGE OIL MAESTRO DRILL (MISCELLANEOUS) ×1 IMPLANT
CLOSURE WOUND 1/2 X4 (GAUZE/BANDAGES/DRESSINGS) ×1
CNTNR URN SCR LID CUP LEK RST (MISCELLANEOUS) ×1 IMPLANT
CONT SPEC 4OZ STRL OR WHT (MISCELLANEOUS) ×3
COVER BACK TABLE 60X90IN (DRAPES) ×3 IMPLANT
COVER MAYO STAND STRL (DRAPES) ×6 IMPLANT
COVER SURGICAL LIGHT HANDLE (MISCELLANEOUS) IMPLANT
COVER WAND RF STERILE (DRAPES) IMPLANT
DIFFUSER DRILL AIR PNEUMATIC (MISCELLANEOUS) ×3 IMPLANT
DRAIN CHANNEL 15F RND FF W/TCR (WOUND CARE) IMPLANT
DRAPE C-ARM 42X72 X-RAY (DRAPES) ×3 IMPLANT
DRAPE C-ARMOR (DRAPES) IMPLANT
DRAPE POUCH INSTRU U-SHP 10X18 (DRAPES) ×3 IMPLANT
DRAPE SURG 17X23 STRL (DRAPES) ×12 IMPLANT
DURAPREP 26ML APPLICATOR (WOUND CARE) ×3 IMPLANT
ELECT BLADE 4.0 EZ CLEAN MEGAD (MISCELLANEOUS) ×3
ELECT CAUTERY BLADE 6.4 (BLADE) ×3 IMPLANT
ELECT REM PT RETURN 9FT ADLT (ELECTROSURGICAL) ×3
ELECTRODE BLDE 4.0 EZ CLN MEGD (MISCELLANEOUS) ×1 IMPLANT
ELECTRODE REM PT RTRN 9FT ADLT (ELECTROSURGICAL) ×1 IMPLANT
EVACUATOR SILICONE 100CC (DRAIN) IMPLANT
FEE INTRAOP MONITOR IMPULS NCS (MISCELLANEOUS) ×1 IMPLANT
FILTER STRAW FLUID ASPIR (MISCELLANEOUS) ×6 IMPLANT
GAUZE 4X4 16PLY RFD (DISPOSABLE) ×3 IMPLANT
GAUZE SPONGE 4X4 12PLY STRL (GAUZE/BANDAGES/DRESSINGS) ×3 IMPLANT
GLOVE BIO SURGEON STRL SZ7 (GLOVE) ×3 IMPLANT
GLOVE BIO SURGEON STRL SZ8 (GLOVE) ×3 IMPLANT
GLOVE BIOGEL PI IND STRL 7.0 (GLOVE) ×1 IMPLANT
GLOVE BIOGEL PI IND STRL 8 (GLOVE) ×1 IMPLANT
GLOVE BIOGEL PI INDICATOR 7.0 (GLOVE) ×2
GLOVE BIOGEL PI INDICATOR 8 (GLOVE) ×2
GOWN STRL REUS W/ TWL LRG LVL3 (GOWN DISPOSABLE) ×2 IMPLANT
GOWN STRL REUS W/ TWL XL LVL3 (GOWN DISPOSABLE) ×1 IMPLANT
GOWN STRL REUS W/TWL LRG LVL3 (GOWN DISPOSABLE) ×6
GOWN STRL REUS W/TWL XL LVL3 (GOWN DISPOSABLE) ×3
INTRAOP MONITOR FEE IMPULS NCS (MISCELLANEOUS) ×1
INTRAOP MONITOR FEE IMPULSE (MISCELLANEOUS) ×3
IV CATH 14GX2 1/4 (CATHETERS) ×3 IMPLANT
KIT BASIN OR (CUSTOM PROCEDURE TRAY) ×3 IMPLANT
KIT POSITION SURG JACKSON T1 (MISCELLANEOUS) ×3 IMPLANT
KIT TURNOVER KIT B (KITS) ×3 IMPLANT
MARKER SKIN DUAL TIP RULER LAB (MISCELLANEOUS) ×6 IMPLANT
NEEDLE 18GX1X1/2 (RX/OR ONLY) (NEEDLE) ×3 IMPLANT
NEEDLE 22X1 1/2 (OR ONLY) (NEEDLE) ×6 IMPLANT
NEEDLE HYPO 25GX1X1/2 BEV (NEEDLE) ×3 IMPLANT
NEEDLE SPNL 18GX3.5 QUINCKE PK (NEEDLE) ×6 IMPLANT
NS IRRIG 1000ML POUR BTL (IV SOLUTION) ×3 IMPLANT
OIL CARTRIDGE MAESTRO DRILL (MISCELLANEOUS) ×3
PACK LAMINECTOMY ORTHO (CUSTOM PROCEDURE TRAY) ×3 IMPLANT
PACK UNIVERSAL I (CUSTOM PROCEDURE TRAY) ×3 IMPLANT
PAD ARMBOARD 7.5X6 YLW CONV (MISCELLANEOUS) ×6 IMPLANT
PATTIES SURGICAL .5 X1 (DISPOSABLE) ×3 IMPLANT
PATTIES SURGICAL .5X1.5 (GAUZE/BANDAGES/DRESSINGS) IMPLANT
PROBE NEUROSIGN BIPOLAR (INSTRUMENTS) ×3 IMPLANT
ROD PRE BENT EXP 40MM (Rod) ×6 IMPLANT
SCREW SET SINGLE INNER (Screw) ×12 IMPLANT
SCREW VIPER CORT FIX 6.00X30 (Screw) ×12 IMPLANT
SPONGE INTESTINAL PEANUT (DISPOSABLE) ×3 IMPLANT
SPONGE SURGIFOAM ABS GEL 100 (HEMOSTASIS) ×3 IMPLANT
STRIP CLOSURE SKIN 1/2X4 (GAUZE/BANDAGES/DRESSINGS) ×2 IMPLANT
SURGIFLO W/THROMBIN 8M KIT (HEMOSTASIS) IMPLANT
SUT MNCRL AB 4-0 PS2 18 (SUTURE) ×3 IMPLANT
SUT VIC AB 0 CT1 18XCR BRD 8 (SUTURE) ×1 IMPLANT
SUT VIC AB 0 CT1 8-18 (SUTURE) ×3
SUT VIC AB 1 CT1 18XCR BRD 8 (SUTURE) ×1 IMPLANT
SUT VIC AB 1 CT1 8-18 (SUTURE) ×3
SUT VIC AB 2-0 CT2 18 VCP726D (SUTURE) ×3 IMPLANT
SYR 20ML LL LF (SYRINGE) ×6 IMPLANT
SYR BULB IRRIGATION 50ML (SYRINGE) ×3 IMPLANT
SYR CONTROL 10ML LL (SYRINGE) ×6 IMPLANT
SYR TB 1ML LUER SLIP (SYRINGE) ×6 IMPLANT
TAP EXPEDIUM DL 4.35 (INSTRUMENTS) ×3 IMPLANT
TAP EXPEDIUM DL 5.0 (INSTRUMENTS) ×3 IMPLANT
TAP EXPEDIUM DL 6.0 (INSTRUMENTS) ×3 IMPLANT
TAPE CLOTH SURG 4X10 WHT LF (GAUZE/BANDAGES/DRESSINGS) ×3 IMPLANT
TRAY FOLEY MTR SLVR 16FR STAT (SET/KITS/TRAYS/PACK) ×3 IMPLANT
WATER STERILE IRR 1000ML POUR (IV SOLUTION) ×3 IMPLANT
YANKAUER SUCT BULB TIP NO VENT (SUCTIONS) ×3 IMPLANT

## 2019-11-07 NOTE — Transfer of Care (Signed)
Immediate Anesthesia Transfer of Care Note  Patient: Sara Nichols  Procedure(s) Performed: RIGHT-SIDED LUMBAR 4-5 TRANSFORAMINAL LUMBAR INTERBODY FUSION WITH INSTRUMENTATION AND ALLOGRAFT (Right Back)  Patient Location: PACU  Anesthesia Type:General  Level of Consciousness: drowsy and patient cooperative  Airway & Oxygen Therapy: Patient Spontanous Breathing  Post-op Assessment: Report given to RN and Post -op Vital signs reviewed and stable  Post vital signs: Reviewed and stable  Last Vitals:  Vitals Value Taken Time  BP 120/70 11/07/19 1637  Temp    Pulse 93 11/07/19 1638  Resp 9 11/07/19 1638  SpO2 96 % 11/07/19 1638  Vitals shown include unvalidated device data.  Last Pain:  Vitals:   11/07/19 0900  TempSrc:   PainSc: 5       Patients Stated Pain Goal: 0 ([id number])  Complications: No apparent anesthesia complications

## 2019-11-07 NOTE — Anesthesia Preprocedure Evaluation (Addendum)
Anesthesia Evaluation  Patient identified by MRN, date of birth, ID band Patient awake    Reviewed: Allergy & Precautions, NPO status , Patient's Chart, lab work & pertinent test results  Airway Mallampati: II  TM Distance: >3 FB Neck ROM: Full    Dental no notable dental hx. (+) Teeth Intact, Dental Advisory Given   Pulmonary asthma ,    Pulmonary exam normal breath sounds clear to auscultation       Cardiovascular negative cardio ROS Normal cardiovascular exam Rhythm:Regular Rate:Normal     Neuro/Psych negative neurological ROS  negative psych ROS   GI/Hepatic Neg liver ROS, GERD  Medicated,  Endo/Other  Hypothyroidism   Renal/GU K+ 4.0 Cr 1.19     Musculoskeletal negative musculoskeletal ROS (+)   Abdominal   Peds  Hematology Hgb 14.0 Plt 379   Anesthesia Other Findings   Reproductive/Obstetrics negative OB ROS                            Anesthesia Physical Anesthesia Plan  ASA: II  Anesthesia Plan: General   Post-op Pain Management:    Induction:   PONV Risk Score and Plan: Treatment may vary due to age or medical condition, Ondansetron and Dexamethasone  Airway Management Planned: Oral ETT and Video Laryngoscope Planned  Additional Equipment: None  Intra-op Plan:   Post-operative Plan: Extubation in OR  Informed Consent: I have reviewed the patients History and Physical, chart, labs and discussed the procedure including the risks, benefits and alternatives for the proposed anesthesia with the patient or authorized representative who has indicated his/her understanding and acceptance.     Dental advisory given  Plan Discussed with: CRNA  Anesthesia Plan Comments:        Anesthesia Quick Evaluation

## 2019-11-07 NOTE — Op Note (Signed)
PATIENT NAME: Sara Nichols   MEDICAL RECORD NO.:   [id number]   DATE OF BIRTH: [birth year]   DATE OF PROCEDURE: 11/07/2019                               OPERATIVE REPORT     PREOPERATIVE DIAGNOSES: 1. Right L5 lumbar radiculopathy, manifesting as severe right leg pain. 2. Large right L4-5 facet cyst, severely compressing the right L5 nerve 3. L4-5 spondylolisthesis   POSTOPERATIVE DIAGNOSES: 1. Right L5 lumbar radiculopathy, manifesting as severe right leg pain. 2. Large right L4-5 facet cyst, severely compressing the right L5 nerve 3. L4-5 spondylolisthesis   PROCEDURES: 1. Complex L4/5 decompression, including meticulous removal of large L4-5 facet cyst, adherent to the dura and traversing right L5 nerve 2. Right-sided L4-5 transforaminal lumbar interbody fusion. 3. Left-sided L4-5 posterolateral fusion. 4. Insertion of interbody device x1 (12 mm lordotic Concorde intervertebral spacer). 5. Placement of segmental posterior instrumentation L4, L5 bilaterally  6. Use of local autograft. 7. Use of morselized allograft - Vivigen 8. Intraoperative use of fluoroscopy.   SURGEON:  Phylliss Bob, MD.   ASSISTANTPricilla Holm, PA-C.   ANESTHESIA:  General endotracheal anesthesia.   COMPLICATIONS:  None.   DISPOSITION:  Stable.   ESTIMATED BLOOD LOSS:  200cc   INDICATIONS FOR SURGERY:  Briefly, Sara Nichols is a pleasant 54 year old female who did present to me with severe and ongoing pain in the right leg. I did feel that her were secondary to the findings noted above. The patient failed conservative care and did wish to proceed with the procedure noted above.   OPERATIVE DETAILS:  On 11/07/2019, the patient was brought to surgery and general endotracheal anesthesia was administered.  The patient was placed prone on a well-padded flat Jackson bed with a spinal frame.  Antibiotics were given and a time-out procedure was performed. The back was prepped and draped in the  usual fashion.  A midline incision was made overlying the L4-5 intervertebral spaces.  The fascia was incised at the midline.  The paraspinal musculature was bluntly swept laterally.  Anatomic landmarks for the pedicles were exposed. Using fluoroscopy, I did cannulate the L4 and L5 pedicles bilaterally, using a medial to lateral cortical trajectory technique.  At this point, 6 x 35 mm screws were placed into the left pedicles, and a 40 mm rod was placed into the tulip heads of the screw, and caps were also placed.  Distraction was then applied across the L4-5 intervertebral space, and the caps were then provisionally tightened.  On the right side, bone wax was placed into the cannulated pedicle holes.  I then proceeded with the decompressive aspect of the procedure at the L4-5 level.    The inferior aspect of L4 was removed, and a partial facetectomy was performed bilaterally at L4-5.  Readily identified was a compressive lesion at the right aspect of the spinal canal.  There was clearly noted to be a facet cyst, severely compressing the dura and traversing right L5 nerve.  I did attempt to create a plane between the cyst and the dura, however, it was extremely difficult to create a safe plane, as there were substantial adhesions between the cyst and the dura.  I then worked lateral to the cyst, and was able to develop a safe plane, and I did thoroughly remove the cyst, and I did thoroughly decompress the dura and traversing right L5 nerve.  Of note, this portion of the procedure was extremely meticulous, and did take approximately 60 minutes, in order to safely remove the cyst material, and in order to safely decompress the traversing right L5 nerve.  At the termination of the decompression however, I was able to identify the lateral margin of the dura and the lateral margin of the traversing right L5 nerve, which was noted to be entirely free and mobile.  At this point, with an assistant holding medial  retraction of the traversing right L5 nerve, I did perform an annulotomy at the posterolateral aspect of the L4-5 intervertebral space.  I then used a series of curettes and pituitary rongeurs to perform a thorough and complete intervertebral diskectomy.  The intervertebral space was then liberally packed with autograft as well as allograft in the form of Vivigen, as was the appropriate-sized intervertebral spacer (12 mm, lordotic).  The spacer was then tamped into position in the usual fashion.  I was very pleased with the press-fit of the spacer.  I then placed 6 mm screws on the right at L4 and L5. A 40-mm rod was then placed and caps were placed. The distraction was then released on the contralateral side.  All caps were then locked.  The wound was copiously irrigated with a total of approximately 3 L prior to placing the bone graft.  Additional autograft and allograft was then packed into the posterolateral gutter on the left side to help aid in the success of the fusion.  The wound was explored for any undue bleeding and there was no substantial bleeding encountered.  Gel-Foam was placed over the laminectomy site.  The wound was then closed in layers using #1 Vicryl followed by 2-0 Vicryl, followed by 4-0 Monocryl.  Benzoin and Steri-Strips were applied followed by sterile dressing.   Of note, did use triggered EMG to test the screws on the left, and there is no screw the tested below 15 mA. There was no sustained abnormal EMG activity noted throughout the surgery.   Of note, Pricilla Holm was my assistant throughout surgery, and did aid in retraction, suctioning, placement of the hardware, and closure.     Phylliss Bob, MD

## 2019-11-07 NOTE — Progress Notes (Signed)
Patient tearful and anxious. Surgeon asked if patient can have something to lower her anxiety. Dr. Valma Cava was called and made aware. No orders given at this time.

## 2019-11-07 NOTE — H&P (Signed)
PREOPERATIVE H&P  Chief Complaint: Right leg pain  HPI: Sara Nichols is a 54 y.o. female who presents with ongoing pain in the right leg  MRI reveals a large right L4/5 facet cyst and instability at L4/5  Patient has failed multiple forms of conservative care and continues to have pain (see office notes for additional details regarding the patient's full course of treatment)  Past Medical History:  Diagnosis Date  . Asthma   . Dyspnea   . GERD (gastroesophageal reflux disease)   . Hyperlipidemia   . Hypothyroidism   . Mild vitamin D deficiency    Past Surgical History:  Procedure Laterality Date  . CARPAL TUNNEL RELEASE Bilateral 07/2016, 07/2017  . TMJ ARTHROPLASTY     Social History   Socioeconomic History  . Marital status: Married    Spouse name: Not on file  . Number of children: 0  . Years of education: Not on file  . Highest education level: Not on file  Occupational History  . Occupation: Designer, fashion/clothing: [employer]  Tobacco Use  . Smoking status: Never Smoker  . Smokeless tobacco: Never Used  Substance and Sexual Activity  . Alcohol use: Not Currently  . Drug use: No  . Sexual activity: Yes    Partners: Male    Birth control/protection: Pill  Other Topics Concern  . Not on file  Social History Narrative  . Not on file   Social Determinants of Health   Financial Resource Strain:   . Difficulty of Paying Living Expenses:   Food Insecurity:   . Worried About Charity fundraiser in the Last Year:   . Arboriculturist in the Last Year:   Transportation Needs:   . Film/video editor (Medical):   Marland Kitchen Lack of Transportation (Non-Medical):   Physical Activity:   . Days of Exercise per Week:   . Minutes of Exercise per Session:   Stress:   . Feeling of Stress :   Social Connections:   . Frequency of Communication with Friends and Family:   . Frequency of Social Gatherings with Friends and Family:   . Attends Religious  Services:   . Active Member of Clubs or Organizations:   . Attends Archivist Meetings:   Marland Kitchen Marital Status:    Family History  Problem Relation Age of Onset  . Thyroid disease Mother   . Asthma Father        uses inhaler  . Cancer Father        prostate & blood  . Hypertension Sister   . Breast cancer Paternal Grandmother   . Colon cancer Maternal Grandfather 80   No Known Allergies Prior to Admission medications   Medication Sig Start Date End Date Taking? Authorizing Provider  budesonide-formoterol (SYMBICORT) 160-4.5 MCG/ACT inhaler Inhale 2 puffs into the lungs 2 (two) times daily.    Yes [provider]  famotidine (PEPCID) 40 MG tablet Take 40 mg by mouth daily.  07/13/18  Yes [provider]  HYDROcodone-acetaminophen (NORCO/VICODIN) 5-325 MG tablet Take 1 tablet by mouth in the morning and at bedtime. 10/22/19  Yes [provider]  levothyroxine (SYNTHROID) 75 MCG tablet Take 75 mcg by mouth daily before breakfast.  11/07/12  Yes [provider]  norethindrone (MICRONOR) 0.35 MG tablet Take 1 tablet (0.35 mg total) by mouth daily. 07/08/19  Yes Megan Salon, MD  valACYclovir (VALTREX) 1000 MG tablet Take 1  tablet (1,000 mg total) by mouth daily as needed. Patient taking differently: Take 1,000 mg by mouth daily as needed (fever blister).  01/11/18  Yes Megan Salon, MD     All other systems have been reviewed and were otherwise negative with the exception of those mentioned in the HPI and as above.  Physical Exam: There were no vitals filed for this visit.  There is no height or weight on file to calculate BMI.  General: Alert, no acute distress Cardiovascular: No pedal edema Respiratory: No cyanosis, no use of accessory musculature Skin: No lesions in the area of chief complaint Neurologic: Sensation intact distally Psychiatric: Patient is competent for consent with normal mood and affect Lymphatic: No axillary or  cervical lymphadenopathy  MUSCULOSKELETAL: + SLR on the right  Assessment/Plan: Right L5 RADICULOPATHY,  SECONDARY TO LARGE RIGHT-SIDED LUMBAR 4-LUMBAR 5 FACET CYST AND INSTABILITY Plan for Procedure(s): RIGHT-SIDED LUMBAR 4-5 TRANSFORAMINAL LUMBAR INTERBODY FUSION WITH INSTRUMENTATION AND ALLOGRAFT   Norva Karvonen, MD 11/07/2019 7:55 AM

## 2019-11-07 NOTE — Anesthesia Procedure Notes (Signed)
Procedure Name: Intubation Date/Time: 11/07/2019 12:04 PM Performed by: Raenette Rover, CRNA Pre-anesthesia Checklist: Patient identified, Emergency Drugs available, Suction available and Patient being monitored Patient Re-evaluated:Patient Re-evaluated prior to induction Oxygen Delivery Method: Circle system utilized Preoxygenation: Pre-oxygenation with 100% oxygen Induction Type: IV induction Ventilation: Mask ventilation without difficulty Laryngoscope Size: Mac and 3 Grade View: Grade I Tube type: Oral Tube size: 7.0 mm Number of attempts: 1 Airway Equipment and Method: Stylet Placement Confirmation: ETT inserted through vocal cords under direct vision,  positive ETCO2 and breath sounds checked- equal and bilateral Secured at: 21 cm Tube secured with: Tape Dental Injury: Teeth and Oropharynx as per pre-operative assessment

## 2019-11-08 MED ORDER — OXYCODONE-ACETAMINOPHEN 5-325 MG PO TABS: 1.0000 | ORAL_TABLET | ORAL | 0 refills | Status: DC | PRN

## 2019-11-08 MED ORDER — METHOCARBAMOL 500 MG PO TABS: 500.0000 mg | ORAL_TABLET | Freq: Four times a day (QID) | ORAL | 2 refills | Status: DC | PRN

## 2019-11-08 NOTE — Progress Notes (Signed)
Patient is transferred from unit 3W to room 3C02 at this time. Alert and in stable condition.

## 2019-11-08 NOTE — TOC Transition Note (Signed)
Transition of Care [facility]) - CM/SW Discharge Note   Patient Details  Name: Sara Nichols MRN: [id number] Date of Birth: 06-18-1966  [facility]) CM/SW Contact:  Pollie Friar, RN Phone Number: 11/08/2019, 11:28 AM   Clinical Narrative:    Pt discharging home today. PT/OT worked with the patient and are leaving it up to MD for [facility] services.  Pt with orders for youth walker. [employer] will deliver the DME to the room. Pt has supervision at home and transportation to home.   Final next level of care: Home/Self Care Barriers to Discharge: No Barriers Identified   Patient Goals and CMS Choice     Choice offered to / list presented to : Patient  Discharge Placement                       Discharge Plan and Services                DME Arranged: Gilford Rile youth DME Agency: [employer] Date DME Agency Contacted: 11/08/19   Representative spoke with at DME Agency: [place] ([place]) Interventions     Readmission Risk Interventions No flowsheet data found.

## 2019-11-08 NOTE — Plan of Care (Signed)

## 2019-11-08 NOTE — Evaluation (Signed)
Physical Therapy Evaluation Patient Details Name: Sara Nichols MRN: [id number] DOB: 1966/02/01 Today's Date: 11/08/2019   History of Present Illness  Pt is 54 yo female who presented with ongoing R leg pain, MRI reveal large R L4/5 facet cyst and instability at L4/L5.  Pt is s/p complex L4/L5 decompression and fusion (see op report). PMH includes asthma, GERD, HLD, hypothyroidism.  Clinical Impression  Pt admitted with above diagnosis. Pt had increased pain in R buttock/calf with transitions but was able to ambulate 200' and perform stairs with min guard for safety.  She was educated on back precautions and transfer techniques. Pt with supportive family and will have supervision at home.  Pt currently with functional limitations due to the deficits listed below (see PT Problem List). Pt will benefit from skilled PT to increase their independence and safety with mobility to allow discharge to the venue listed below.       Follow Up Recommendations Follow surgeon's recommendation for DC plan and follow-up therapies;Supervision/Assistance - 24 hour    Equipment Recommendations  Other (comment)(youth RW)    Recommendations for Other Services       Precautions / Restrictions Precautions Precautions: Back Precaution Booklet Issued: Yes (comment) Required Braces or Orthoses: Spinal Brace Spinal Brace: Thoracolumbosacral orthotic;Applied in sitting position Restrictions Weight Bearing Restrictions: No      Mobility  Bed Mobility Overal bed mobility: Needs Assistance Bed Mobility: Sit to Supine       Sit to supine: Min assist   General bed mobility comments: cues for log roll; min A for legs  Transfers Overall transfer level: Needs assistance Equipment used: Rolling walker (2 wheeled) Transfers: Sit to/from Stand Sit to Stand: Min guard         General transfer comment: pt extends R leg for pain control with sit/stand (cued to just rest on ground and not hold in air  for even more pain control);  did better pushing up on RW than on bed - stabilized RW  Ambulation/Gait Ambulation/Gait assistance: Min guard Gait Distance (Feet): 200 Feet Assistive device: Rolling walker (2 wheeled) Gait Pattern/deviations: Step-through pattern     General Gait Details: initially significantly decreased speed but improved; safe use of RW  Stairs Stairs: Yes Stairs assistance: Min guard Stair Management: Two rails;Step to pattern Number of Stairs: 2 General stair comments: cues for "up with good and down with bad"  Wheelchair Mobility    Modified Rankin (Stroke Patients Only)       Balance Overall balance assessment: Needs assistance Sitting-balance support: No upper extremity supported;Feet supported Sitting balance-Leahy Scale: Good     Standing balance support: Bilateral upper extremity supported;During functional activity Standing balance-Leahy Scale: Fair                               Pertinent Vitals/Pain Pain Assessment: 0-10 Pain Score: 8  Pain Location: R buttock with sit/stand; otherwise just stated discomfort in back Pain Descriptors / Indicators: Shooting;Sharp Pain Intervention(s): Limited activity within patient's tolerance;Monitored during session;Premedicated before session;Relaxation;Repositioned    Home Living Family/patient expects to be discharged to:: Private residence   Available Help at Discharge: Family;Available 24 hours/day;Other (Comment) Type of Home: House Home Access: Stairs to enter Entrance Stairs-Rails: Can reach both Entrance Stairs-Number of Steps: 8 Home Layout: One level Home Equipment: Adaptive equipment Additional Comments: Pt has 2 boxers at home - worried over them being too excited (discussed safety techniques with dogs)  Prior Function Level of Independence: Needs assistance   Gait / Transfers Assistance Needed: Still able to ambulate without AD but carefully  ADL's / Homemaking  Assistance Needed: Pt reports prior to July she was completly independent and very active.  Had severe back pain in July that improved but returned in January.  Spouse has been assisting with lower body dressing since Jan.        Hand Dominance        Extremity/Trunk Assessment   Upper Extremity Assessment Upper Extremity Assessment: Overall WFL for tasks assessed    Lower Extremity Assessment Lower Extremity Assessment: RLE deficits/detail(Demonstrating normal ROM and at least 3/5 strength but not furhter tested due to acuity of sx and pain) RLE Deficits / Details: reports sharp pain/spasm with transitional movement in R buttock and calf; was having these prior to surgery RLE Sensation: (reports sensation improved since sx)    Cervical / Trunk Assessment Cervical / Trunk Assessment: Other exceptions(s/p surgery)  Communication   Communication: No difficulties  Cognition Arousal/Alertness: Awake/alert Behavior During Therapy: WFL for tasks assessed/performed Overall Cognitive Status: Within Functional Limits for tasks assessed                                        General Comments General comments (skin integrity, edema, etc.): Brace in place.  Pt educated on back precautions, safety at home, transfer techniques, and back brace.    Exercises     Assessment/Plan    PT Assessment Patient needs continued PT services  PT Problem List Decreased strength;Decreased mobility;Decreased safety awareness;Decreased range of motion;Decreased coordination;Decreased activity tolerance;Decreased balance;Decreased knowledge of use of DME;Pain       PT Treatment Interventions DME instruction;Therapeutic activities;Modalities;Gait training;Therapeutic exercise;Patient/family education;Stair training;Balance training;Functional mobility training    PT Goals (Current goals can be found in the Care Plan section)  Acute Rehab PT Goals Patient Stated Goal: to go home today;  decrease pain PT Goal Formulation: With patient/family Time For Goal Achievement: 11/22/19 Potential to Achieve Goals: Good    Frequency 7X/week   Barriers to discharge        Co-evaluation               AM-PAC PT "6 Clicks" Mobility  Outcome Measure Help needed turning from your back to your side while in a flat bed without using bedrails?: A Little Help needed moving from lying on your back to sitting on the side of a flat bed without using bedrails?: A Little Help needed moving to and from a bed to a chair (including a wheelchair)?: A Little Help needed standing up from a chair using your arms (e.g., wheelchair or bedside chair)?: A Little Help needed to walk in hospital room?: None Help needed climbing 3-5 steps with a railing? : A Little 6 Click Score: 19    End of Session Equipment Utilized During Treatment: Gait belt;Back brace Activity Tolerance: Patient tolerated treatment well Patient left: in bed;with call bell/phone within reach;with bed alarm set Nurse Communication: Mobility status PT Visit Diagnosis: Other abnormalities of gait and mobility (R26.89);Pain Pain - Right/Left: Right Pain - part of body: (back/buttock)    Time: [id number] PT Time Calculation (min) (ACUTE ONLY): 28 min   Charges:   PT Evaluation $PT Eval Low Complexity: 1 Low PT Treatments $Gait Training: 8-22 mins        Maggie Font, PT Acute Rehab Services Pager [phone]  Gershon Mussel [facility] Rehab [phone] [facility] [phone]   Karlton Lemon 11/08/2019, 12:01 PM

## 2019-11-08 NOTE — Progress Notes (Signed)
    Patient doing well Denies leg pain + expected low back discomfort  PATIENT HAS NOT BEEN OUT OF BED   Physical Exam: Vitals:   11/07/19 2349 11/08/19 0427  BP: 129/88 137/83  Pulse: 93 91  Resp: 17 18  Temp: 98.1 F (36.7 C) 98.8 F (37.1 C)  SpO2: 100% 99%   Dressing in place NVI  POD #1 s/p L4/5 decompression and fusion, doing very well  - up with PT/OT, encourage ambulation - Percocet for pain, Valium for muscle spasms - d/c home today with f/u in 2 weeks  DESPITE ORDERS TO BE OOB, PATIENT HAS NOT BEEN OOB. THIS IS UNACCEPTABLE. I WILL SPEAK WITH NURSING DIRECTOR REGARDING NURSING IGNORING ORDERS, AND WILL TRANSFER PATIENT TO 3C.

## 2019-11-08 NOTE — Evaluation (Signed)
Occupational Therapy Evaluation Patient Details Name: Sara Nichols MRN: [id number] DOB: [birth year] Today's Date: 11/08/2019    History of Present Illness 54 yo female s/p L4-5 TLIF allograft with cyst removal. PMH: asthma   Clinical Impression   Patient is s/p L4-5 TLIF surgery resulting in functional limitations due to the deficits listed below (see OT problem list). Pt currently requires RW for pain management and stability with transfers due to R LE pain. Pt reports pressure as only pain once up in chair and positioned. Pt pleased with session and no further questions at this time.  Patient will benefit from skilled OT acutely to increase independence and safety with ADLS to allow discharge home with ped RW.  Pt and spouse wore mask entire session.     Follow Up Recommendations  No OT follow up    Equipment Recommendations  Other (comment)(PED RW ( pt is 4ft tall))    Recommendations for Other Services       Precautions / Restrictions Precautions Precautions: Back Precaution Booklet Issued: Yes (comment) Precaution Comments: reviewed for adls with back preCAUTIONS educated on back brace Required Braces or Orthoses: Spinal Brace Spinal Brace: Thoracolumbosacral orthotic;Applied in sitting position      Mobility Bed Mobility Overal bed mobility: Needs Assistance Bed Mobility: Rolling;Supine to Sit Rolling: Min guard   Supine to sit: Min assist     General bed mobility comments: pt able to follow sequence and needed (A) to elevate trunk from bed surface  Transfers Overall transfer level: Needs assistance Equipment used: Rolling walker (2 wheeled) Transfers: Sit to/from Stand Sit to Stand: Min assist         General transfer comment: pt (A) to power up the first time. pt able to sustain once standing    Balance                                           ADL either performed or assessed with clinical judgement   ADL Overall ADL's :  Needs assistance/impaired Eating/Feeding: Independent   Grooming: Wash/dry hands;Supervision/safety;Standing   Upper Body Bathing: Minimal assistance;Sitting   Lower Body Bathing: Moderate assistance;Sit to/from stand       Lower Body Dressing: Moderate assistance;Sit to/from stand Lower Body Dressing Details (indicate cue type and reason): educated on use of reacher pt has at hoem to dress. pt is nearly able to complete a figure 4 position to don R LE clothing Toilet Transfer: Minimal assistance;Ambulation;RW;BSC   Toileting- Water quality scientist and Hygiene: Min guard;Sitting/lateral lean Toileting - Clothing Manipulation Details (indicate cue type and reason): discussed multiple positions with wiping and use of wet wipes     Functional mobility during ADLs: Minimal assistance;Rolling walker General ADL Comments: pt with R LE discomfort initially. pt educated on ROM of bil LE supine and then sequence for OOB     Vision Baseline Vision/History: Wears glasses Wears Glasses: Reading only       Perception     Praxis      Pertinent Vitals/Pain Pain Assessment: No/denies pain     Hand Dominance Right   Extremity/Trunk Assessment Upper Extremity Assessment Upper Extremity Assessment: Overall WFL for tasks assessed   Lower Extremity Assessment Lower Extremity Assessment: RLE deficits/detail RLE Deficits / Details: pt describes muscle spasm with movement and in transitional movements   Cervical / Trunk Assessment Cervical / Trunk Assessment: Other exceptions(s/p surg)  Communication Communication Communication: No difficulties   Cognition Arousal/Alertness: Awake/alert Behavior During Therapy: WFL for tasks assessed/performed Overall Cognitive Status: Within Functional Limits for tasks assessed                                     General Comments  bandage intact and dressing dry at this time    Exercises     Shoulder Instructions      Home  Living Family/patient expects to be discharged to:: Private residence Living Arrangements: Spouse/significant other Available Help at Discharge: Family;Available 24 hours/day;Other (Comment)(sister can help) Type of Home: House Home Access: Stairs to enter [employer] of Steps: 8 Entrance Stairs-Rails: Can reach both Home Layout: One level     Bathroom Shower/Tub: Teacher, early years/pre: Standard     Home Equipment: Financial controller: Reacher Additional Comments: two boxers 2 cats       Prior Functioning/Environment Level of Independence: Independent    ADL's / Homemaking Assistance Needed: right side help dressing (shoe or sock) only driving to doctor appointments due to pain and reports foot going to sleep with driving due to positioning            OT Problem List: Decreased strength;Decreased activity tolerance;Impaired balance (sitting and/or standing);Decreased safety awareness;Decreased knowledge of use of DME or AE;Decreased knowledge of precautions;Pain      OT Treatment/Interventions: Self-care/ADL training;Therapeutic exercise;Neuromuscular education;DME and/or AE instruction;Manual therapy;Therapeutic activities;Patient/family education;Balance training    OT Goals(Current goals can be found in the care plan section) Acute Rehab OT Goals Patient Stated Goal: to go home today OT Goal Formulation: With patient Time For Goal Achievement: 11/22/19 Potential to Achieve Goals: Good  OT Frequency: Min 2X/week   Barriers to D/C:            Co-evaluation              AM-PAC OT "6 Clicks" Daily Activity     Outcome Measure Help from another person eating meals?: None Help from another person taking care of personal grooming?: A Little Help from another person toileting, which includes using toliet, bedpan, or urinal?: A Little Help from another person bathing (including washing, rinsing, drying)?: A Little Help  from another person to put on and taking off regular upper body clothing?: A Little Help from another person to put on and taking off regular lower body clothing?: A Little 6 Click Score: 19   End of Session Equipment Utilized During Treatment: Rolling walker;Back brace Nurse Communication: Mobility status;Precautions  Activity Tolerance: Patient tolerated treatment well Patient left: in chair;with call bell/phone within reach;with family/visitor present  OT Visit Diagnosis: Unsteadiness on feet (R26.81);Muscle weakness (generalized) (M62.81)                Time: [id number] OT Time Calculation (min): 44 min Charges:  OT General Charges $OT Visit: 1 Visit OT Evaluation $OT Eval Moderate Complexity: 1 Mod OT Treatments $Self Care/Home Management : 23-37 mins   Brynn, OTR/L  Acute Rehabilitation Services Pager: [phone] Office: [phone] .   Jeri Modena 11/08/2019, 9:51 AM

## 2019-11-09 NOTE — Progress Notes (Signed)
Patient is discharged from room 3C02 at this time. Alert and in stable condition. IV site d/c'd and instructions read to patient and spouse with understanding verbalized and all questions answered. Left unit via wheelchair with all belongings at side.  ?

## 2019-11-09 NOTE — Progress Notes (Signed)
Physical Therapy Treatment Patient Details Name: Sara Nichols MRN: [id number] DOB: Oct 27, 1965 Today's Date: 11/09/2019    History of Present Illness Pt is 54 yo female who presented with ongoing R leg pain, MRI reveal large R L4/5 facet cyst and instability at L4/L5.  Pt is s/p complex L4/L5 decompression and fusion (see op report). PMH includes asthma, GERD, HLD, hypothyroidism.    PT Comments    Pt greatly limited by R hip pain. Pt in tears and has most difficulty with transitions supine to sit, sit to supine and sit to stand. Due to pain pt unable to complete stair negotiation. Pt has 8 STE. RN provided pain medicine. PT to return later in morning to re-attempt stairs. Discussed with pt that the pain will be there whether she is at home, in the hospital or in rehab and encouraged her to take pain meds and muscle relaxer as scheduled to inhibit pain getting unbearable. Acute PT to cont to follow.    Follow Up Recommendations  Home health PT;Supervision/Assistance - 24 hour     Equipment Recommendations  Rolling walker with 5" wheels    Recommendations for Other Services       Precautions / Restrictions Precautions Precautions: Back Precaution Booklet Issued: Yes (comment) Precaution Comments: reviewed for adls with back preCAUTIONS educated on back brace Required Braces or Orthoses: Spinal Brace Spinal Brace: Thoracolumbosacral orthotic;Applied in sitting position Restrictions Weight Bearing Restrictions: No Other Position/Activity Restrictions: pt self R LE WBAT due to pain    Mobility  Bed Mobility               General bed mobility comments: pt sitting EOB upon PT arrival  Transfers Overall transfer level: Needs assistance Equipment used: Rolling walker (2 wheeled) Transfers: Sit to/from Stand Sit to Stand: Min guard         General transfer comment: significant increase in time, cries in pain when attempting to power up, suspect its the contraction  of glut muscles over sciatic nerve  Ambulation/Gait Ambulation/Gait assistance: Min guard Gait Distance (Feet): 120 Feet Assistive device: Rolling walker (2 wheeled) Gait Pattern/deviations: Step-to pattern;Decreased stride length Gait velocity: slow   General Gait Details: very short step length, increased bilat UE dependence   Stairs Stairs: Yes       General stair comments: attempted to do stairs x2 however pt crying in excrutiating pain and unable to advance up 1 step despite maxA from PT and railing, pt has 8 STE home   Wheelchair Mobility    Modified Rankin (Stroke Patients Only)       Balance Overall balance assessment: Needs assistance Sitting-balance support: No upper extremity supported;Feet supported Sitting balance-Leahy Scale: Good     Standing balance support: Bilateral upper extremity supported;During functional activity Standing balance-Leahy Scale: Fair Standing balance comment: dependent on RW for safe amb                            Cognition Arousal/Alertness: Awake/alert Behavior During Therapy: WFL for tasks assessed/performed Overall Cognitive Status: Within Functional Limits for tasks assessed                                        Exercises      General Comments General comments (skin integrity, edema, etc.): pt in tears over R Hip pain      Pertinent Vitals/Pain  Pain Assessment: 0-10 Pain Score: 10-Worst pain ever Pain Location: R buttock with sit/stand; otherwise just stated discomfort in back Pain Descriptors / Indicators: Shooting;Sharp Pain Intervention(s): Monitored during session    Home Living                      Prior Function            PT Goals (current goals can now be found in the care plan section) Acute Rehab PT Goals Patient Stated Goal: stop the right hip pain Progress towards PT goals: Not progressing toward goals - comment    Frequency    Min 5X/week      PT  Plan Frequency needs to be updated    Co-evaluation              AM-PAC PT "6 Clicks" Mobility   Outcome Measure  Help needed turning from your back to your side while in a flat bed without using bedrails?: A Little Help needed moving from lying on your back to sitting on the side of a flat bed without using bedrails?: A Little Help needed moving to and from a bed to a chair (including a wheelchair)?: A Little Help needed standing up from a chair using your arms (e.g., wheelchair or bedside chair)?: A Little Help needed to walk in hospital room?: A Little Help needed climbing 3-5 steps with a railing? : A Lot 6 Click Score: 17    End of Session Equipment Utilized During Treatment: Gait belt;Back brace Activity Tolerance: Patient tolerated treatment well Patient left: in bed;with call bell/phone within reach(sitting EOB) Nurse Communication: Mobility status PT Visit Diagnosis: Other abnormalities of gait and mobility (R26.89);Pain Pain - Right/Left: Right     Time: [id number] PT Time Calculation (min) (ACUTE ONLY): 31 min  Charges:  $Gait Training: 8-22 mins $Therapeutic Activity: 8-22 mins                     Kittie Plater, PT, DPT Acute Rehabilitation Services Pager #: [phone] Office #: [phone]    Berline Lopes 11/09/2019, 9:25 AM

## 2019-11-09 NOTE — Progress Notes (Signed)
Physical Therapy Treatment Patient Details Name: Sara Nichols MRN: [id number] DOB: [birth year] Today's Date: 11/09/2019    History of Present Illness Pt is 54 yo female who presented with ongoing R leg pain, MRI reveal large R L4/5 facet cyst and instability at L4/L5.  Pt is s/p complex L4/L5 decompression and fusion (see op report). PMH includes asthma, GERD, HLD, hypothyroidism.    PT Comments    Pt spouse present and completed stair negotiation with PT. Pt spouse with good understanding and return demonstration on how to assist pt on stairs and with transfers. Pt with decreased pain since she's had pain medicine. Discussed car transfers as well. Acute PT to cont to follow.    Follow Up Recommendations  Home health PT;Supervision/Assistance - 24 hour     Equipment Recommendations  Rolling walker with 5" wheels    Recommendations for Other Services       Precautions / Restrictions Precautions Precautions: Back Precaution Booklet Issued: Yes (comment) Precaution Comments: reviewed for adls with back preCAUTIONS educated on back brace Required Braces or Orthoses: Spinal Brace Spinal Brace: Thoracolumbosacral orthotic;Applied in sitting position Restrictions Weight Bearing Restrictions: No Other Position/Activity Restrictions: pt self R LE WBAT due to pain    Mobility  Bed Mobility               General bed mobility comments: pt up in chair upon PT arrival  Transfers Overall transfer level: Needs assistance Equipment used: Rolling walker (2 wheeled) Transfers: Sit to/from Stand Sit to Stand: Min assist         General transfer comment: underarm assist due to pain, held down walker due to pt insisting on pushing up with bilat UE on RW  Ambulation/Gait Ambulation/Gait assistance: Min guard Gait Distance (Feet): 120 Feet Assistive device: Rolling walker (2 wheeled) Gait Pattern/deviations: Step-through pattern Gait velocity: slow   General Gait  Details: increased step length compared to earlier session, more fluid pattern, verbal cues to decreased bilat UE dependency   Stairs Stairs: Yes Stairs assistance: Mod assist;+2 physical assistance Stair Management: Step to pattern(bilat UE HHA) Number of Stairs: 2 General stair comments: spouse present, they decided to use 3 steps with no handrail at home, spouse assisted on R side and PT on the L, pt screaming in pain but able to complete step to stair negotiation safely   Wheelchair Mobility    Modified Rankin (Stroke Patients Only)       Balance Overall balance assessment: Needs assistance Sitting-balance support: No upper extremity supported;Feet supported Sitting balance-Leahy Scale: Good     Standing balance support: Bilateral upper extremity supported;During functional activity Standing balance-Leahy Scale: Fair Standing balance comment: dependent on RW for safe amb                            Cognition Arousal/Alertness: Awake/alert Behavior During Therapy: WFL for tasks assessed/performed Overall Cognitive Status: Within Functional Limits for tasks assessed                                        Exercises      General Comments General comments (skin integrity, edema, etc.): pt con't to be in tears in pain during stair negotiation      Pertinent Vitals/Pain Pain Assessment: 0-10(20/20 on stairs) Pain Score: 7  Pain Location: R buttock with sit/stand; otherwise just stated discomfort in  back Pain Descriptors / Indicators: Shooting;Sharp Pain Intervention(s): Monitored during session    Home Living                      Prior Function            PT Goals (current goals can now be found in the care plan section) Acute Rehab PT Goals Patient Stated Goal: stop the right hip pain Progress towards PT goals: Progressing toward goals    Frequency    Min 5X/week      PT Plan Frequency needs to be updated     Co-evaluation              AM-PAC PT "6 Clicks" Mobility   Outcome Measure  Help needed turning from your back to your side while in a flat bed without using bedrails?: A Little Help needed moving from lying on your back to sitting on the side of a flat bed without using bedrails?: A Little Help needed moving to and from a bed to a chair (including a wheelchair)?: A Little Help needed standing up from a chair using your arms (e.g., wheelchair or bedside chair)?: A Little Help needed to walk in hospital room?: A Little Help needed climbing 3-5 steps with a railing? : A Lot 6 Click Score: 17    End of Session Equipment Utilized During Treatment: Gait belt;Back brace Activity Tolerance: Patient tolerated treatment well Patient left: in chair;with call bell/phone within reach;with family/visitor present Nurse Communication: Mobility status PT Visit Diagnosis: Other abnormalities of gait and mobility (R26.89);Pain Pain - Right/Left: Right     Time: [id number] PT Time Calculation (min) (ACUTE ONLY): 17 min  Charges:  $Gait Training: 8-22 mins $Therapeutic Activity: 8-22 mins                     Sara Nichols, PT, DPT Acute Rehabilitation Services Pager #: [phone] Office #: [phone]    Berline Lopes 11/09/2019, 9:53 AM

## 2019-11-09 NOTE — TOC Transition Note (Signed)
Transition of Care [facility]) - CM/SW Discharge Note   Patient Details  Name: Sara Nichols MRN: [id number] Date of Birth: [birth year]  [facility] of Care [facility]) CM/SW Contact:  Claudie Leach, RN 11/09/2019, 9:12 AM   Clinical Narrative:    Pt to d/c home with [facility] PT/OT.  [place] services to start Tuesday or Wednesday.     Final next level of care: [place] Barriers to Discharge: No Barriers Identified    Discharge Plan and Services                DME Arranged: [facility] youth DME Agency: [employer] Date DME Agency Contacted: 11/08/19   Representative spoke with at DME Agency: [place]: PT, OT, Nurse's Aide [place] Agency: [place] Date [place]: 11/09/19 Time [place]: [id number] Representative spoke with at [place]: Candi Leash

## 2019-11-09 NOTE — Progress Notes (Signed)
     Sara Nichols is a 54 y.o. female   Orthopaedic diagnosis:POD #2 s/p L4/5 decompression and fusion  Subjective: Patient is sitting up at the bedside working with physical therapy.  She is complaining of pain.  She states "it is the nerve pain." She states between her doses of pain medicine she struggles but otherwise doing okay.  Objectyive: Vitals:   11/09/19 0353 11/09/19 0722  BP: 132/77 (!) 124/97  Pulse: (!) 108 (!) 109  Resp: 16 18  Temp: 98.4 F (36.9 C) 98.3 F (36.8 C)  SpO2: 98% 100%     Exam: Awake and alert Respirations even and unlabored No acute distress  She is seated at the bedside.  She is tearful.  Endorses sensation to light touch about bilateral legs.  Assessment: Postop day 2 status post L4/5 decompression and fusion, complaining of "nerve" pain.   Plan: She will continue to work with physical therapy.  Encourage ambulation. Percocet for pain, Valium for muscle spasms Discharge home today and follow-up in 2 weeks with Dr. Lynann Bologna if she clears physical therapy.   Radene Journey, MD

## 2019-11-09 NOTE — Progress Notes (Signed)
Occupational Therapy Treatment Patient Details Name: Sara Nichols MRN: [id number] DOB: 1966/04/30 Today's Date: 11/09/2019    History of present illness Pt is 54 yo female who presented with ongoing R leg pain, MRI reveal large R L4/5 facet cyst and instability at L4/L5.  Pt is s/p complex L4/L5 decompression and fusion (see op report). PMH includes asthma, GERD, HLD, hypothyroidism.   OT comments  Pt progressing to OOB ADL. Pt using reacher for LB dressing as RLE weak and painful with set-upA to minA. Pt's UB was set-upA only. Pt stood at sink for grooming with no difficulty. Pt ambulating in room reliant on youth RW for stability. pt reports that her spouse will assist her as needed. Pt tearful about RLE "nerve pain." Pt given pain meds and muscle relaxer prior to OTR arrival. Pt set-upA for toilet hygiene at commode. Back precautions reviewed. Pt would benefit from continued OT skilled services.   Follow Up Recommendations  Home health OT    Equipment Recommendations  None recommended by OT    Recommendations for Other Services      Precautions / Restrictions Precautions Precautions: Back Precaution Booklet Issued: Yes (comment) Precaution Comments: reviewed for ADL precautions; pt able to state 3/3. Required Braces or Orthoses: Spinal Brace Spinal Brace: Thoracolumbosacral orthotic;Applied in sitting position Restrictions Weight Bearing Restrictions: No Other Position/Activity Restrictions: pt self R LE WBAT due to pain       Mobility Bed Mobility               General bed mobility comments: up in recliner  Transfers Overall transfer level: Needs assistance Equipment used: Rolling walker (2 wheeled) Transfers: Sit to/from Stand Sit to Stand: Min assist         General transfer comment: underarm assist due to pain, held down walker due to pt insisting on pushing up with bilat UE on RW    Balance Overall balance assessment: Needs  assistance Sitting-balance support: No upper extremity supported;Feet supported Sitting balance-Leahy Scale: Good     Standing balance support: Bilateral upper extremity supported;During functional activity Standing balance-Leahy Scale: Fair Standing balance comment: dependent on RW for safe amb                           ADL either performed or assessed with clinical judgement   ADL Overall ADL's : Needs assistance/impaired                 Upper Body Dressing : Supervision/safety;Set up;Sitting;Standing Upper Body Dressing Details (indicate cue type and reason): donning shirt and brace with set-upA Lower Body Dressing: Min guard;Minimal assistance;Cueing for safety;Cueing for sequencing;Adhering to back precautions;Sitting/lateral leans;Sit to/from stand Lower Body Dressing Details (indicate cue type and reason): Pt requiring minA  without use reacher or set-upA with reacher Toilet Transfer: Min guard;Ambulation;Stand-pivot;Cueing for safety;RW;Grab bars   Toileting- Clothing Manipulation and Hygiene: Min guard;Sitting/lateral lean Toileting - Clothing Manipulation Details (indicate cue type and reason): discussed multiple positions with wiping and use of wet wipes     Functional mobility during ADLs: Min guard;Rolling walker;Cueing for safety;Cueing for sequencing General ADL Comments: Pt using reacher for LB dressing as RLE weak and painful. Pt's UB was set-upA only. Pt stood at sink for grooming with no difficulty. Pt ambulating in room reliant on youth RW for stability. pt reports that her spouse will assist her as needed.     Vision   Vision Assessment?: No apparent visual deficits  Perception     Praxis      Cognition Arousal/Alertness: Awake/alert Behavior During Therapy: WFL for tasks assessed/performed Overall Cognitive Status: Within Functional Limits for tasks assessed                                          Exercises      Shoulder Instructions       General Comments Pt tearful, but determined throughout session    Pertinent Vitals/ Pain       Pain Assessment: 0-10 Pain Score: 7  Pain Location: R buttock with sit/stand; otherwise just stated discomfort in back Pain Descriptors / Indicators: Shooting;Sharp Pain Intervention(s): Monitored during session;Premedicated before session;Repositioned;Ice applied  Home Living                                          Prior Functioning/Environment              Frequency  Min 2X/week        Progress Toward Goals  OT Goals(current goals can now be found in the care plan section)  Progress towards OT goals: Progressing toward goals  Acute Rehab OT Goals Patient Stated Goal: stop the right hip pain OT Goal Formulation: With patient Time For Goal Achievement: 11/22/19 Potential to Achieve Goals: Good ADL Goals Pt Will Perform Lower Body Dressing: with modified independence;with adaptive equipment Pt Will Transfer to Toilet: with modified independence;bedside commode;ambulating  Plan Discharge plan remains appropriate    Co-evaluation                 AM-PAC OT "6 Clicks" Daily Activity     Outcome Measure   Help from another person eating meals?: None Help from another person taking care of personal grooming?: A Little Help from another person toileting, which includes using toliet, bedpan, or urinal?: A Little Help from another person bathing (including washing, rinsing, drying)?: A Little Help from another person to put on and taking off regular upper body clothing?: A Little Help from another person to put on and taking off regular lower body clothing?: A Little 6 Click Score: 19    End of Session Equipment Utilized During Treatment: Rolling walker;Back brace  OT Visit Diagnosis: Unsteadiness on feet (R26.81);Muscle weakness (generalized) (M62.81)   Activity Tolerance Patient tolerated treatment well;Patient  limited by pain   Patient Left in chair;with call bell/phone within reach   Nurse Communication Mobility status;Precautions        Time: 0829-0900 OT Time Calculation (min): 31 min  Charges: OT General Charges $OT Visit: 1 Visit OT Treatments $Self Care/Home Management : 23-37 mins  Jefferey Pica, OTR/L Acute Rehabilitation Services Pager: [phone] Office: [phone]    Illya Gienger  C 11/09/2019, 10:30 AM

## 2019-11-10 ENCOUNTER — Encounter: Payer: Self-pay | Admitting: *Deleted

## 2019-11-12 NOTE — Anesthesia Postprocedure Evaluation (Signed)
Anesthesia Post Note  Patient: Sara Nichols  Procedure(s) Performed: RIGHT-SIDED LUMBAR 4-5 TRANSFORAMINAL LUMBAR INTERBODY FUSION WITH INSTRUMENTATION AND ALLOGRAFT (Right Back)     Patient location during evaluation: PACU Anesthesia Type: General Level of consciousness: awake and alert Pain management: pain level controlled Vital Signs Assessment: post-procedure vital signs reviewed and stable Respiratory status: spontaneous breathing, nonlabored ventilation, respiratory function stable and patient connected to nasal cannula oxygen Cardiovascular status: blood pressure returned to baseline and stable Postop Assessment: no apparent nausea or vomiting Anesthetic complications: no    Last Vitals:  Vitals:   11/09/19 0353 11/09/19 0722  BP: 132/77 (!) 124/97  Pulse: (!) 108 (!) 109  Resp: 16 18  Temp: 36.9 C 36.8 C  SpO2: 98% 100%    Last Pain:  Vitals:   11/09/19 0924  TempSrc:   PainSc: 6    Pain Goal: Patients Stated Pain Goal: 3 (11/09/19 0356)                 Aretta Stetzel

## 2019-11-12 NOTE — Discharge Summary (Signed)
Patient ID: Sara Nichols MRN: [id number] DOB/AGE: 1966-04-15 54 y.o.  Admit date: 11/07/2019 Discharge date: 11/09/2019  Admission Diagnoses:  Active Problems:   Radiculopathy   Discharge Diagnoses:  Same  Past Medical History:  Diagnosis Date  . Asthma   . Dyspnea   . GERD (gastroesophageal reflux disease)   . Hyperlipidemia   . Hypothyroidism   . Mild vitamin D deficiency     Surgeries: Procedure(s): RIGHT-SIDED LUMBAR 4-5 TRANSFORAMINAL LUMBAR INTERBODY FUSION WITH INSTRUMENTATION AND ALLOGRAFT on 11/07/2019   Consultants: None  Discharged Condition: Improved  Hospital Course: Sara Nichols is an 54 y.o. female who was admitted 11/07/2019 for operative treatment of radiculopathy. Patient has severe unremitting pain that affects sleep, daily activities, and work/hobbies. After pre-op clearance the patient was taken to the operating room on 11/07/2019 and underwent  Procedure(s): RIGHT-SIDED LUMBAR 4-5 TRANSFORAMINAL LUMBAR INTERBODY FUSION WITH INSTRUMENTATION AND ALLOGRAFT.    Patient was given perioperative antibiotics:  Anti-infectives (From admission, onward)   Start     Dose/Rate Route Frequency Ordered Stop   11/07/19 2230  ceFAZolin (ANCEF) IVPB 2g/100 mL premix     2 g 200 mL/hr over 30 Minutes Intravenous Every 8 hours 11/07/19 2217 11/08/19 1241   11/07/19 0849  ceFAZolin (ANCEF) 2-4 GM/100ML-% IVPB    Note to Pharmacy: Cordelia Pen   : cabinet override      11/07/19 0849 11/07/19 1208   11/07/19 0845  ceFAZolin (ANCEF) IVPB 2g/100 mL premix     2 g 200 mL/hr over 30 Minutes Intravenous On call to O.R. 11/07/19 [id number] 11/07/19 1208       Patient was given sequential compression devices, early ambulation to prevent DVT.  Patient benefited maximally from hospital stay and there were no complications.    Recent vital signs: No data found.   Discharge Medications:   Allergies as of 11/09/2019   No Known Allergies     Medication List     TAKE these medications   famotidine 40 MG tablet Commonly known as: PEPCID Take 40 mg by mouth daily.   methocarbamol 500 MG tablet Commonly known as: ROBAXIN Take 1 tablet (500 mg total) by mouth every 6 (six) hours as needed for muscle spasms.   norethindrone 0.35 MG tablet Commonly known as: MICRONOR Take 1 tablet (0.35 mg total) by mouth daily.   oxyCODONE-acetaminophen 5-325 MG tablet Commonly known as: PERCOCET/ROXICET Take 1-2 tablets by mouth every 4 (four) hours as needed for moderate pain or severe pain.   Symbicort 160-4.5 MCG/ACT inhaler Generic drug: budesonide-formoterol Inhale 2 puffs into the lungs 2 (two) times daily.   Synthroid 75 MCG tablet Generic drug: levothyroxine Take 75 mcg by mouth daily before breakfast.   valACYclovir 1000 MG tablet Commonly known as: VALTREX Take 1 tablet (1,000 mg total) by mouth daily as needed. What changed: reasons to take this       Diagnostic Studies: DG Lumbar Spine 2-3 Views  Result Date: 11/07/2019 CLINICAL DATA:  L4-5 TLIF. EXAM: LUMBAR SPINE - 2-3 VIEW COMPARISON:  Earlier same day. FINDINGS: Posterior fusion hardware is intact at the L4-5 level. Interbody fusion at the L4-5 level. Minimal spondylosis of the lumbar spine. Disc spaces are unremarkable. IMPRESSION: Posterior fusion hardware intact at the L4-5 level. Electronically Signed   By: Marin Olp M.D.   On: 11/07/2019 16:31   DG Lumbar Spine 1 View  Result Date: 11/07/2019 CLINICAL DATA:  Lumbar spine surgery EXAM: LUMBAR SPINE - 1 VIEW  COMPARISON:  Portable cross-table lateral view 1221 hours compared to 05/18/2007 FINDINGS: Five lumbar vertebra present on prior exam. Metallic probes via dorsal approach project dorsal to the spinous processes of L2 and L3. IMPRESSION: Posterior localization of the spinous processes of L2 and L3. Electronically Signed   By: Lavonia Dana M.D.   On: 11/07/2019 13:51   DG C-Arm 1-60 Min  Result Date: 11/07/2019 CLINICAL  DATA:  Lumbar fusion. EXAM: DG C-ARM 1-60 MIN CONTRAST:  No prior. FLUOROSCOPY TIME:  Fluoroscopy Time:  1 minutes 32 seconds. COMPARISON:  11/07/2019. FINDINGS: Lumbar spine numbered with the lowest segmented appearing lumbar shaped vertebrae on lateral view as L5. L4-L5 posterior and interbody fusion. Hardware intact. Anatomic alignment. IMPRESSION: L4-L5 posterior and interbody fusion. Electronically Signed   By: Marcello Moores  Register   On: 11/07/2019 16:27    Disposition: Discharge disposition: 01-Home or Self Care        POD #1 s/p L4/5 decompression and fusion, doing very well  - up with PT/OT, encourage ambulation - Percocet for pain, Valium for muscle spasms -Scripts for pain sent to pharmacy electronically  -D/C instructions sheet printed and in chart -D/C today  -F/U in office 2 weeks   Signed: Lennie Muckle Kehaulani Fruin 11/12/2019, 1:05 PM

## 2019-12-16 DIAGNOSIS — Z9889 Other specified postprocedural states: Secondary | ICD-10-CM | POA: Diagnosis not present

## 2019-12-25 ENCOUNTER — Other Ambulatory Visit: Payer: Self-pay | Admitting: Family Medicine

## 2019-12-25 DIAGNOSIS — Z1231 Encounter for screening mammogram for malignant neoplasm of breast: Secondary | ICD-10-CM

## 2020-01-27 DIAGNOSIS — Z9889 Other specified postprocedural states: Secondary | ICD-10-CM | POA: Diagnosis not present

## 2020-03-27 ENCOUNTER — Ambulatory Visit: Payer: BC Managed Care – PPO

## 2020-04-09 ENCOUNTER — Ambulatory Visit
Admission: RE | Admit: 2020-04-09 | Discharge: 2020-04-09 | Disposition: A | Discharge status: Home / Self Care | Payer: BC Managed Care – PPO | Source: Ambulatory Visit | Attending: Family Medicine | Admitting: Family Medicine

## 2020-04-09 ENCOUNTER — Other Ambulatory Visit: Payer: Self-pay

## 2020-04-09 DIAGNOSIS — Z1231 Encounter for screening mammogram for malignant neoplasm of breast: Secondary | ICD-10-CM | POA: Diagnosis not present

## 2020-05-04 DIAGNOSIS — M545 Low back pain: Secondary | ICD-10-CM | POA: Diagnosis not present

## 2020-06-11 DIAGNOSIS — E782 Mixed hyperlipidemia: Secondary | ICD-10-CM | POA: Diagnosis not present

## 2020-06-11 DIAGNOSIS — K219 Gastro-esophageal reflux disease without esophagitis: Secondary | ICD-10-CM | POA: Diagnosis not present

## 2020-06-11 DIAGNOSIS — J4520 Mild intermittent asthma, uncomplicated: Secondary | ICD-10-CM | POA: Diagnosis not present

## 2020-06-11 DIAGNOSIS — E039 Hypothyroidism, unspecified: Secondary | ICD-10-CM | POA: Diagnosis not present

## 2020-06-11 DIAGNOSIS — Z23 Encounter for immunization: Secondary | ICD-10-CM | POA: Diagnosis not present

## 2020-07-30 ENCOUNTER — Other Ambulatory Visit: Payer: Self-pay | Admitting: Obstetrics & Gynecology

## 2020-07-30 NOTE — Telephone Encounter (Signed)
Spoke with patient regarding refill on Micronor. Patient of Dr.Miller's. Advised she would need AEX prior to refill. Patient offered AEX with Karma Ganja, NP 08-04-20 9:00am to arrive at 8:45 to register. She voiced understanding. Has enough pills until appointment.

## 2020-08-03 NOTE — Progress Notes (Signed)
54 y.o. G0P0000 Married White or Caucasian female here for annual exam.     Using Micronor for birth control. Had [place] 2019= 52.7 and [place] 2020= 51.1 (she was on Micronor during blood draw) Is scared to have pregnancy accident. Per Dr. Ammie Ferrier note, she can have [place] drawn one more time this year if she felt more comfortable. She would like that.  She is willing to do colonoscopy this year. Referral to be made  Reports concern of anal itching. Would like to see if she has a hemorrhoid. Itching is at night, but other times during the day as well. She hasn't used anything on it yet. Started about 1 week ago.   No LMP recorded. (Menstrual status: Other).         LMP 2018 Sexually active: Yes.    The current method of family planning is oral progesterone-only contraceptive.    Exercising: No.  exercise Smoker:  no  Health Maintenance: Pap:  09-22-16 neg HPV HR neg History of abnormal Pap:  no MMG:  04-09-2020 category c density birads 1:neg Colonoscopy:  Not done BMD:   none TDaP:  2018 Gardasil:   n/a Covid-19: moderna Pneumonia vaccine(s):  Not done Shingrix:   Not done Hep C testing: not done Screening Labs: with PCP   reports that she has never smoked. She has never used smokeless tobacco. She reports previous alcohol use. She reports that she does not use drugs.  Past Medical History:  Diagnosis Date  . Asthma   . Dyspnea   . GERD (gastroesophageal reflux disease)   . Hyperlipidemia   . Hypothyroidism   . Mild vitamin D deficiency     Past Surgical History:  Procedure Laterality Date  . CARPAL TUNNEL RELEASE Bilateral 07/2016, 07/2017  . TMJ ARTHROPLASTY    . TRANSFORAMINAL LUMBAR INTERBODY FUSION (TLIF) WITH PEDICLE SCREW FIXATION 1 LEVEL Right 11/07/2019   Procedure: RIGHT-SIDED LUMBAR 4-5 TRANSFORAMINAL LUMBAR INTERBODY FUSION WITH INSTRUMENTATION AND ALLOGRAFT;  Surgeon: Phylliss Bob, MD;  Location: [place];  Service: Orthopedics;  Laterality: Right;    Current  Outpatient Medications  Medication Sig Dispense Refill  . budesonide-formoterol (SYMBICORT) 160-4.5 MCG/ACT inhaler Inhale 2 puffs into the lungs 2 (two) times daily.     . famotidine (PEPCID) 40 MG tablet Take 40 mg by mouth daily.     Marland Kitchen levothyroxine (SYNTHROID) 75 MCG tablet Take 75 mcg by mouth daily before breakfast.     . Magnesium 500 MG TABS 1 tablet with a meal    . norethindrone (MICRONOR) 0.35 MG tablet Take 1 tablet (0.35 mg total) by mouth daily. 84 tablet 4  . Potassium 99 MG TABS 1 tablet    . Probiotic TBEC See admin instructions.    . valACYclovir (VALTREX) 1000 MG tablet Take 1 tablet (1,000 mg total) by mouth daily as needed. (Patient taking differently: Take 1,000 mg by mouth daily as needed (fever blister).) 30 tablet 1   No current facility-administered medications for this visit.    Family History  Problem Relation Age of Onset  . Thyroid disease Mother   . Asthma Father        uses inhaler  . Cancer Father        prostate & blood  . Hypertension Sister   . Breast cancer Paternal Grandmother   . Colon cancer Maternal Grandfather 80    Review of Systems  Constitutional: Negative.   HENT: Negative.   Eyes: Negative.   Respiratory: Negative.   Cardiovascular:  Negative.   Gastrointestinal: Negative.        Hemorrhoid, itching?  Endocrine: Negative.   Genitourinary: Negative.   Musculoskeletal: Negative.   Skin: Negative.   Allergic/Immunologic: Negative.   Neurological: Negative.   Hematological: Negative.   Psychiatric/Behavioral: Negative.     Exam:   BP 110/68   Pulse 68   Resp 16   Ht 5' 0.25" (1.53 m)   Wt 150 lb (68 kg)   BMI 29.05 kg/m   Height: 5' 0.25" (153 cm)  General appearance: alert, cooperative and appears stated age Head: Normocephalic, without obvious abnormality, atraumatic Neck: no adenopathy, supple, symmetrical, trachea midline and thyroid normal to inspection and palpation Lungs: clear to auscultation  bilaterally Breasts: normal appearance, no masses or tenderness Heart: regular rate and rhythm Abdomen: soft, non-tender; bowel sounds normal; no masses,  no organomegaly Extremities: extremities normal, atraumatic, no cyanosis or edema Skin: Skin color, texture, turgor normal. No rashes or lesions Lymph nodes: Cervical, supraclavicular, and axillary nodes normal. No abnormal inguinal nodes palpated Neurologic: Grossly normal   Pelvic: External genitalia:  no lesions              Urethra:  normal appearing urethra with no masses, tenderness or lesions              Bartholins and Skenes: normal                 Vagina: normal appearing vagina with atrophy and discharge,               Cervix: no cervical motion tenderness and no lesions              Pap taken: No. Bimanual Exam:  Uterus:  normal size, contour, position, consistency, mobility, non-tender              Adnexa: no mass, fullness, tenderness               Rectovaginal: Confirms               Anus:  normal sphincter tone, no lesions, no hemorrhoid noted, unable to identify cause of itching  Lovena Le, CMA Chaperone was present for exam.  A:  Well Woman with normal exam  Anal irritation/itching  P:   Mammogram, next due 03/2021  pap smear-  co-testing neg 2018, co-testing due 2023  Will send referral for colonoscopy  D/C micronor, future order for [facility] in 6 weeks  Try triamcinolone and nystatin to anal area, call if no relief  return annually or prn

## 2020-08-04 ENCOUNTER — Ambulatory Visit: Payer: BC Managed Care – PPO | Admitting: Nurse Practitioner

## 2020-08-04 ENCOUNTER — Other Ambulatory Visit: Payer: Self-pay

## 2020-08-04 ENCOUNTER — Telehealth: Payer: Self-pay

## 2020-08-04 ENCOUNTER — Encounter: Payer: Self-pay | Admitting: Nurse Practitioner

## 2020-08-04 VITALS — BP 110/68 | HR 68 | Resp 16 | Ht 60.25 in | Wt 150.0 lb

## 2020-08-04 DIAGNOSIS — Z3041 Encounter for surveillance of contraceptive pills: Secondary | ICD-10-CM | POA: Diagnosis not present

## 2020-08-04 DIAGNOSIS — Z01419 Encounter for gynecological examination (general) (routine) without abnormal findings: Secondary | ICD-10-CM

## 2020-08-04 DIAGNOSIS — Z1211 Encounter for screening for malignant neoplasm of colon: Secondary | ICD-10-CM

## 2020-08-04 DIAGNOSIS — K6289 Other specified diseases of anus and rectum: Secondary | ICD-10-CM | POA: Diagnosis not present

## 2020-08-04 MED ORDER — NYSTATIN 100000 UNIT/GM EX CREA: TOPICAL_CREAM | CUTANEOUS | 0 refills | Status: DC

## 2020-08-04 MED ORDER — TRIAMCINOLONE ACETONIDE 0.1 % EX OINT: TOPICAL_OINTMENT | CUTANEOUS | 1 refills | Status: DC

## 2020-08-04 NOTE — Patient Instructions (Signed)
Anal Pruritus Anal pruritus is an itchy feeling in the anus and on the skin around the anus. This is common and can be caused by many things. It often occurs when the area becomes moist. Moisture may be due to sweating or to a small amount of stool (feces) that is left on the area because of poor personal cleaning. Some other causes include:  Things that can irritate your skin, such as: ? Perfumed soaps and sprays. ? Colored or scented toilet paper. ? Excessive washing.  Certain foods, such as caffeine, beer, and spicy foods.  Diarrhea or loose stool.  Skin disorders (psoriasis, eczema, or seborrhea).  Hemorrhoids, fissures, infections, and other anal diseases.  Other medical conditions, such as diabetes, thyroid problems, STIs (sexually transmitted infections), or some cancers. In many cases, the cause is not known. The itching usually goes away with treatment and home care. Scratching can cause further skin damage and make the problem worse. Follow these instructions at home: Skin care      Practice good hygiene. ? Clean the anal area gently with wet toilet paper or a wet washcloth after every bowel movement and at bedtime. ? Avoid using soaps on the anal area. ? Dry the area thoroughly. Pat the area dry with toilet paper or a towel.  Do not scrub the anal area with anything, including toilet paper.  Do not scratch the itchy area. Scratching causes more damage and makes the itching worse.  Take sitz baths as told by your health care provider. ? A sitz bath is a warm water bath that only comes up to your hips and covers your buttocks. A sitz bath may be done at home in a bathtub or with a portable sitz bath that fits over the toilet. ? Pat the area dry with a soft cloth after each bath.  Use creams or ointments as told by your health care provider. Zinc oxide ointment or a moisture barrier cream can be applied several times a day to protect and heal the skin.  Do not use  anything that irritates the skin, such as bubble baths, scented toilet paper, or genital deodorants. General instructions  Pay attention to any changes in your symptoms.  Take or apply over-the-counter and prescription medicines only as told by your health care provider.  Avoid overusing medicines that help you have a bowel movement (laxatives). These can cause you to have loose stools.  Talk with your health care provider about whether you should increase the fiber in your diet. This can help keep your stool normal if you have frequent loose stools.  Limit or avoid foods that may cause your symptoms. These may include: ? Spicy foods, such as salsa, jalapeo peppers, and spicy seasonings. ? Caffeine or beer. ? Milk products. ? Chocolate, nuts, citrus fruits, or tomatoes.  Wear cotton underwear and loose clothing.  Keep all follow-up visits as told by your health care provider. This is important. Contact a health care provider if:  Your itching does not improve in several days.  Your itching gets worse.  You have a fever.  You have redness, swelling, or pain in the anal area.  You have fluid, blood, or pus coming from the anal area. Summary  Anal pruritus is an itchy feeling in the anus and the skin in the anal area. This can be caused by many things, such as things that irritate your skin and certain medical conditions.  Take or apply over-the-counter and prescription medicines only as told  by your health care provider.  Practice good hygiene as told by your health care provider.  Talk with your health care provider about fiber supplements. These are helpful in keeping your stool normal if you have frequent loose stools.  Contact a health care provider if your symptoms get worse or if you develop new symptoms. This information is not intended to replace advice given to you by your health care provider. Make sure you discuss any questions you have with your health care  provider. Document Revised: 12/18/2017 Document Reviewed: 12/18/2017 Elsevier Patient Education  [place] Maintenance for Postmenopausal Women Menopause is a normal process in which your ability to get pregnant comes to an end. This process happens slowly over many months or years, usually between the ages of 52 and 49. Menopause is complete when you have missed your menstrual periods for 12 months. It is important to talk with your health care provider about some of the most common conditions that affect women after menopause (postmenopausal women). These include heart disease, cancer, and bone loss (osteoporosis). Adopting a healthy lifestyle and getting preventive care can help to promote your health and wellness. The actions you take can also lower your chances of developing some of these common conditions. What should I know about menopause? During menopause, you may get a number of symptoms, such as:  Hot flashes. These can be moderate or severe.  Night sweats.  Decrease in sex drive.  Mood swings.  Headaches.  Tiredness.  Irritability.  Memory problems.  Insomnia. Choosing to treat or not to treat these symptoms is a decision that you make with your health care provider. Do I need hormone replacement therapy?  Hormone replacement therapy is effective in treating symptoms that are caused by menopause, such as hot flashes and night sweats.  Hormone replacement carries certain risks, especially as you become older. If you are thinking about using estrogen or estrogen with progestin, discuss the benefits and risks with your health care provider. What is my risk for heart disease and stroke? The risk of heart disease, heart attack, and stroke increases as you age. One of the causes may be a change in the body's hormones during menopause. This can affect how your body uses dietary fats, triglycerides, and cholesterol. Heart attack and stroke are medical  emergencies. There are many things that you can do to help prevent heart disease and stroke. Watch your blood pressure  High blood pressure causes heart disease and increases the risk of stroke. This is more likely to develop in people who have high blood pressure readings, are of African descent, or are overweight.  Have your blood pressure checked: ? Every 3-5 years if you are 16-72 years of age. ? Every year if you are 29 years old or older. Eat a healthy diet   Eat a diet that includes plenty of vegetables, fruits, low-fat dairy products, and lean protein.  Do not eat a lot of foods that are high in solid fats, added sugars, or sodium. Get regular exercise Get regular exercise. This is one of the most important things you can do for your health. Most adults should:  Try to exercise for at least 150 minutes each week. The exercise should increase your heart rate and make you sweat (moderate-intensity exercise).  Try to do strengthening exercises at least twice each week. Do these in addition to the moderate-intensity exercise.  Spend less time sitting. Even light physical activity can be beneficial. Other tips  Work with your health care provider to achieve or maintain a healthy weight.  Do not use any products that contain nicotine or tobacco, such as cigarettes, e-cigarettes, and chewing tobacco. If you need help quitting, ask your health care provider.  Know your numbers. Ask your health care provider to check your cholesterol and your blood sugar (glucose). Continue to have your blood tested as directed by your health care provider. Do I need screening for cancer? Depending on your health history and family history, you may need to have cancer screening at different stages of your life. This may include screening for:  Breast cancer.  Cervical cancer.  Lung cancer.  Colorectal cancer. What is my risk for osteoporosis? After menopause, you may be at increased risk for  osteoporosis. Osteoporosis is a condition in which bone destruction happens more quickly than new bone creation. To help prevent osteoporosis or the bone fractures that can happen because of osteoporosis, you may take the following actions:  If you are 29-19 years old, get at least 1,000 mg of calcium and at least 600 mg of vitamin D per day.  If you are older than age 53 but younger than age 48, get at least 1,200 mg of calcium and at least 600 mg of vitamin D per day.  If you are older than age 54, get at least 1,200 mg of calcium and at least 800 mg of vitamin D per day. Smoking and drinking excessive alcohol increase the risk of osteoporosis. Eat foods that are rich in calcium and vitamin D, and do weight-bearing exercises several times each week as directed by your health care provider. How does menopause affect my mental health? Depression may occur at any age, but it is more common as you become older. Common symptoms of depression include:  Low or sad mood.  Changes in sleep patterns.  Changes in appetite or eating patterns.  Feeling an overall lack of motivation or enjoyment of activities that you previously enjoyed.  Frequent crying spells. Talk with your health care provider if you think that you are experiencing depression. General instructions See your health care provider for regular wellness exams and vaccines. This may include:  Scheduling regular health, dental, and eye exams.  Getting and maintaining your vaccines. These include: ? Influenza vaccine. Get this vaccine each year before the flu season begins. ? Pneumonia vaccine. ? Shingles vaccine. ? Tetanus, diphtheria, and pertussis (Tdap) booster vaccine. Your health care provider may also recommend other immunizations. Tell your health care provider if you have ever been abused or do not feel safe at home. Summary  Menopause is a normal process in which your ability to get pregnant comes to an end.  This  condition causes hot flashes, night sweats, decreased interest in sex, mood swings, headaches, or lack of sleep.  Treatment for this condition may include hormone replacement therapy.  Take actions to keep yourself healthy, including exercising regularly, eating a healthy diet, watching your weight, and checking your blood pressure and blood sugar levels.  Get screened for cancer and depression. Make sure that you are up to date with all your vaccines. This information is not intended to replace advice given to you by your health care provider. Make sure you discuss any questions you have with your health care provider. Document Revised: 08/01/2018 Document Reviewed: 08/01/2018 Elsevier Patient Education  2020 [employer].

## 2020-08-04 NOTE — Telephone Encounter (Signed)
-----   Message from Sara Ganja, NP sent at 08/04/2020  3:20 PM EST ----- This patient is 8 and is ready to schedule colonoscopy. She has never had one. She is okay to go where ever. I suggested [facility] and she was fine with this. Anytime is fine. Thank you

## 2020-08-04 NOTE — Telephone Encounter (Signed)
Referral placed to [facility] with first available provider.  Cc: Hayley for referral Encounter closed

## 2020-09-29 ENCOUNTER — Other Ambulatory Visit: Payer: Self-pay

## 2020-09-29 ENCOUNTER — Other Ambulatory Visit: Payer: Self-pay | Admitting: Nurse Practitioner

## 2020-09-29 ENCOUNTER — Other Ambulatory Visit (INDEPENDENT_AMBULATORY_CARE_PROVIDER_SITE_OTHER): Payer: BC Managed Care – PPO

## 2020-09-29 DIAGNOSIS — N912 Amenorrhea, unspecified: Secondary | ICD-10-CM

## 2020-09-30 LAB — FOLLICLE STIMULATING HORMONE: FSH: 44.3 m[IU]/mL

## 2020-10-02 ENCOUNTER — Ambulatory Visit: Payer: BC Managed Care – PPO

## 2020-11-03 ENCOUNTER — Other Ambulatory Visit: Payer: Self-pay | Admitting: Gastroenterology

## 2020-11-03 ENCOUNTER — Ambulatory Visit (AMBULATORY_SURGERY_CENTER): Payer: BC Managed Care – PPO | Admitting: *Deleted

## 2020-11-03 ENCOUNTER — Other Ambulatory Visit: Payer: Self-pay

## 2020-11-03 VITALS — Ht 60.25 in | Wt 150.0 lb

## 2020-11-03 DIAGNOSIS — Z1211 Encounter for screening for malignant neoplasm of colon: Secondary | ICD-10-CM

## 2020-11-03 MED ORDER — PLENVU 140 G PO SOLR: 1.0000 | Freq: Once | ORAL | 0 refills | Status: DC

## 2020-11-03 MED ORDER — PEG-KCL-NACL-NASULF-NA ASC-C 100 G PO SOLR: 1.0000 | Freq: Once | ORAL | 0 refills | Status: DC

## 2020-11-03 NOTE — Progress Notes (Signed)
Pt's previsit is done over the phone and all paperwork (prep instructions, blank consent form to just read over, pre-procedure acknowledgement form and stamped envelope) sent to patient  covid vaccines x2  Pt states she has PONV after surgery, denies being told that she was difficult, fam hx/hx of malignant hyperthemia   No egg or soy allergy  No home oxygen use   No medications for weight loss taken  emmi information given  Pt denies constipation issues  Pt informed that we do not do prior authorizations for prep  Moviprep sent to pharmacy

## 2020-11-03 NOTE — Telephone Encounter (Signed)
Spoke with pharmacy- Plenvu isn't available and generic Moviprep will cost pt $108.00  I discussed this with pt and offered Sutab- she is agreeable to this.  New instructions will be sent to pt along with coupon.  Sutab code put into RX.

## 2020-11-17 ENCOUNTER — Encounter: Payer: Self-pay | Admitting: Gastroenterology

## 2020-11-17 ENCOUNTER — Other Ambulatory Visit: Payer: Self-pay

## 2020-11-17 ENCOUNTER — Ambulatory Visit (AMBULATORY_SURGERY_CENTER): Payer: BC Managed Care – PPO | Admitting: Gastroenterology

## 2020-11-17 VITALS — BP 128/73 | HR 82 | Temp 98.0°F | Resp 28 | Ht 60.25 in | Wt 150.0 lb

## 2020-11-17 DIAGNOSIS — Z1211 Encounter for screening for malignant neoplasm of colon: Secondary | ICD-10-CM

## 2020-11-17 DIAGNOSIS — D123 Benign neoplasm of transverse colon: Secondary | ICD-10-CM

## 2020-11-17 HISTORY — PX: COLONOSCOPY: SHX174

## 2020-11-17 MED ORDER — SODIUM CHLORIDE 0.9 % IV SOLN: 500.0000 mL | Freq: Once | INTRAVENOUS | Status: DC

## 2020-11-17 NOTE — Progress Notes (Signed)
Pt's states no medical or surgical changes since previsit or office visit.  ° °Vitals CW °

## 2020-11-17 NOTE — Patient Instructions (Signed)
Handout given:  Diverticulosis, Hemorrhoids, polyps Resume previous diet Continue current medications Await pathology results YOU HAD AN ENDOSCOPIC PROCEDURE TODAY AT [facility]:   Refer to the procedure report that was given to you for any specific questions about what was found during the examination.  If the procedure report does not answer your questions, please call your gastroenterologist to clarify.  If you requested that your care partner not be given the details of your procedure findings, then the procedure report has been included in a sealed envelope for you to review at your convenience later.  YOU SHOULD EXPECT: Some feelings of bloating in the abdomen. Passage of more gas than usual.  Walking can help get rid of the air that was put into your GI tract during the procedure and reduce the bloating. If you had a lower endoscopy (such as a colonoscopy or flexible sigmoidoscopy) you may notice spotting of blood in your stool or on the toilet paper. If you underwent a bowel prep for your procedure, you may not have a normal bowel movement for a few days.  Please Note:  You might notice some irritation and congestion in your nose or some drainage.  This is from the oxygen used during your procedure.  There is no need for concern and it should clear up in a day or so.  SYMPTOMS TO REPORT IMMEDIATELY:   Following lower endoscopy (colonoscopy or flexible sigmoidoscopy):  Excessive amounts of blood in the stool  Significant tenderness or worsening of abdominal pains  Swelling of the abdomen that is new, acute  Fever of 100F or higher  For urgent or emergent issues, a gastroenterologist can be reached at any hour by calling [phone]. Do not use MyChart messaging for urgent concerns.   DIET:  We do recommend a small meal at first, but then you may proceed to your regular diet.  Drink plenty of fluids but you should avoid alcoholic beverages for 24 hours.  ACTIVITY:   You should plan to take it easy for the rest of today and you should NOT DRIVE or use heavy machinery until tomorrow (because of the sedation medicines used during the test).    FOLLOW UP: Our staff will call the number listed on your records 48-72 hours following your procedure to check on you and address any questions or concerns that you may have regarding the information given to you following your procedure. If we do not reach you, we will leave a message.  We will attempt to reach you two times.  During this call, we will ask if you have developed any symptoms of COVID 19. If you develop any symptoms (ie: fever, flu-like symptoms, shortness of breath, cough etc.) before then, please call [phone].  If you test positive for Covid 19 in the 2 weeks post procedure, please call and report this information to [place].    If any biopsies were taken you will be contacted by phone or by letter within the next 1-3 weeks.  Please call us at [phone] if you have not heard about the biopsies in 3 weeks.   SIGNATURES/CONFIDENTIALITY: You and/or your care partner have signed paperwork which will be entered into your electronic medical record.  These signatures attest to the fact that that the information above on your After Visit Summary has been reviewed and is understood.  Full responsibility of the confidentiality of this discharge information lies with you and/or your care-partner.

## 2020-11-17 NOTE — Progress Notes (Signed)
PT taken to PACU. Monitors in place. VSS. Report given to RN. 

## 2020-11-17 NOTE — Op Note (Signed)
[facility] Patient Name: Sara Nichols Procedure Date: 11/17/2020 7:56 AM MRN: [id number] Endoscopist: Mauri Pole , MD Age: 55 Referring MD:  Date of Birth: [birth year] Gender: Female Account #: [id number] Procedure:                Colonoscopy Indications:              Screening for colorectal malignant neoplasm Medicines:                Monitored Anesthesia Care Procedure:                Pre-Anesthesia Assessment:                           - Prior to the procedure, a History and Physical                            was performed, and patient medications and                            allergies were reviewed. The patient's tolerance of                            previous anesthesia was also reviewed. The risks                            and benefits of the procedure and the sedation                            options and risks were discussed with the patient.                            All questions were answered, and informed consent                            was obtained. Prior Anticoagulants: The patient has                            taken no previous anticoagulant or antiplatelet                            agents. ASA Grade Assessment: II - A patient with                            mild systemic disease. After reviewing the risks                            and benefits, the patient was deemed in                            satisfactory condition to undergo the procedure.                           After obtaining informed consent, the colonoscope  was passed under direct vision. Throughout the                            procedure, the patient's blood pressure, pulse, and                            oxygen saturations were monitored continuously. The                            Olympus PCF-H190DL ([id number]) Colonoscope was                            introduced through the anus and advanced to the the                            cecum,  identified by appendiceal orifice and                            ileocecal valve. The colonoscopy was performed                            without difficulty. The patient tolerated the                            procedure well. The quality of the bowel                            preparation was excellent. The terminal ileum,                            ileocecal valve, appendiceal orifice, and rectum                            were photographed. Scope In: 8:07:23 AM Scope Out: 8:25:41 AM Scope Withdrawal Time: 0 hours 12 minutes 42 seconds  Total Procedure Duration: 0 hours 18 minutes 18 seconds  Findings:                 The perianal and digital rectal examinations were                            normal.                           Two sessile polyps were found in the transverse                            colon. The polyps were 6 to 8 mm in size. These                            polyps were removed with a cold snare. Resection                            and retrieval were complete.  Scattered small-mouthed diverticula were found in                            the sigmoid colon, descending colon, transverse                            colon and ascending colon.                           Non-bleeding external and internal hemorrhoids were                            found during retroflexion. The hemorrhoids were                            medium-sized.                           The exam was otherwise without abnormality. Complications:            No immediate complications. Estimated Blood Loss:     Estimated blood loss was minimal. Impression:               - Two 6 to 8 mm polyps in the transverse colon,                            removed with a cold snare. Resected and retrieved.                           - Diverticulosis in the sigmoid colon, in the                            descending colon, in the transverse colon and in                            the ascending  colon.                           - Non-bleeding external and internal hemorrhoids.                           - The examination was otherwise normal. Recommendation:           - Patient has a contact number available for                            emergencies. The signs and symptoms of potential                            delayed complications were discussed with the                            patient. Return to normal activities tomorrow.                            Written discharge instructions were provided to the  patient.                           - Resume previous diet.                           - Continue present medications.                           - Await pathology results.                           - Repeat colonoscopy in 5-10 years for surveillance                            based on pathology results. Mauri Pole, MD 11/17/2020 8:31:13 AM This report has been signed electronically.

## 2020-11-17 NOTE — Progress Notes (Signed)
Called to room to assist during endoscopic procedure.  Patient ID and intended procedure confirmed with present staff. Received instructions for my participation in the procedure from the performing physician.  

## 2020-11-19 ENCOUNTER — Telehealth: Payer: Self-pay

## 2020-11-19 NOTE — Telephone Encounter (Signed)
  Follow up Call-  Call back number 11/17/2020  Post procedure Call Back phone  # [phone]  Permission to leave phone message Yes  Some recent data might be hidden     Patient questions:  Do you have a fever, pain , or abdominal swelling? No. Pain Score  0 *  Have you tolerated food without any problems? Yes.    Have you been able to return to your normal activities? Yes.    Do you have any questions about your discharge instructions: Diet   No. Medications  No. Follow up visit  No.  Do you have questions or concerns about your Care? No.  Actions: * If pain score is 4 or above: No action needed, pain <4.  1. Have you developed a fever since your procedure? no  2.   Have you had an respiratory symptoms (SOB or cough) since your procedure? no  3.   Have you tested positive for COVID 19 since your procedure no  4.   Have you had any family members/close contacts diagnosed with the COVID 19 since your procedure?  no   If yes to any of these questions please route to Joylene John, RN and Joella Prince, RN

## 2020-11-27 ENCOUNTER — Encounter: Payer: Self-pay | Admitting: Gastroenterology

## 2021-03-30 DIAGNOSIS — E782 Mixed hyperlipidemia: Secondary | ICD-10-CM | POA: Diagnosis not present

## 2021-03-30 DIAGNOSIS — E039 Hypothyroidism, unspecified: Secondary | ICD-10-CM | POA: Diagnosis not present

## 2021-03-30 DIAGNOSIS — R252 Cramp and spasm: Secondary | ICD-10-CM | POA: Diagnosis not present

## 2021-03-30 DIAGNOSIS — J4520 Mild intermittent asthma, uncomplicated: Secondary | ICD-10-CM | POA: Diagnosis not present

## 2021-03-30 DIAGNOSIS — F419 Anxiety disorder, unspecified: Secondary | ICD-10-CM | POA: Diagnosis not present

## 2021-05-19 ENCOUNTER — Other Ambulatory Visit: Payer: Self-pay | Admitting: Family Medicine

## 2021-05-19 ENCOUNTER — Other Ambulatory Visit: Payer: Self-pay | Admitting: Physical Medicine and Rehabilitation

## 2021-05-19 DIAGNOSIS — Z1231 Encounter for screening mammogram for malignant neoplasm of breast: Secondary | ICD-10-CM

## 2021-05-24 ENCOUNTER — Other Ambulatory Visit: Payer: Self-pay

## 2021-05-24 ENCOUNTER — Ambulatory Visit
Admission: RE | Admit: 2021-05-24 | Discharge: 2021-05-24 | Disposition: A | Discharge status: Home / Self Care | Payer: BC Managed Care – PPO | Source: Ambulatory Visit | Attending: Physical Medicine and Rehabilitation | Admitting: Physical Medicine and Rehabilitation

## 2021-05-24 DIAGNOSIS — Z1231 Encounter for screening mammogram for malignant neoplasm of breast: Secondary | ICD-10-CM

## 2021-06-23 ENCOUNTER — Other Ambulatory Visit: Payer: Self-pay | Admitting: Family Medicine

## 2021-06-23 DIAGNOSIS — Z0000 Encounter for general adult medical examination without abnormal findings: Secondary | ICD-10-CM | POA: Diagnosis not present

## 2021-06-23 DIAGNOSIS — R1011 Right upper quadrant pain: Secondary | ICD-10-CM | POA: Diagnosis not present

## 2021-06-23 DIAGNOSIS — R252 Cramp and spasm: Secondary | ICD-10-CM | POA: Diagnosis not present

## 2021-06-23 DIAGNOSIS — E782 Mixed hyperlipidemia: Secondary | ICD-10-CM | POA: Diagnosis not present

## 2021-06-23 DIAGNOSIS — K219 Gastro-esophageal reflux disease without esophagitis: Secondary | ICD-10-CM | POA: Diagnosis not present

## 2021-07-12 ENCOUNTER — Ambulatory Visit (HOSPITAL_COMMUNITY)
Admission: EM | Admit: 2021-07-12 | Discharge: 2021-07-12 | Disposition: A | Discharge status: Home / Self Care | Payer: BC Managed Care – PPO | Attending: Emergency Medicine | Admitting: Emergency Medicine

## 2021-07-12 ENCOUNTER — Encounter (HOSPITAL_COMMUNITY): Payer: Self-pay

## 2021-07-12 ENCOUNTER — Emergency Department (HOSPITAL_COMMUNITY): Payer: BC Managed Care – PPO | Admitting: Anesthesiology

## 2021-07-12 ENCOUNTER — Emergency Department (HOSPITAL_COMMUNITY): Payer: BC Managed Care – PPO

## 2021-07-12 ENCOUNTER — Other Ambulatory Visit: Payer: Self-pay

## 2021-07-12 ENCOUNTER — Encounter (HOSPITAL_COMMUNITY)
Admission: EM | Disposition: A | Discharge status: Home / Self Care | Payer: Self-pay | Source: Home / Self Care | Attending: Emergency Medicine

## 2021-07-12 DIAGNOSIS — Z20822 Contact with and (suspected) exposure to covid-19: Secondary | ICD-10-CM | POA: Diagnosis not present

## 2021-07-12 DIAGNOSIS — K8010 Calculus of gallbladder with chronic cholecystitis without obstruction: Secondary | ICD-10-CM | POA: Insufficient documentation

## 2021-07-12 DIAGNOSIS — K8020 Calculus of gallbladder without cholecystitis without obstruction: Secondary | ICD-10-CM | POA: Diagnosis not present

## 2021-07-12 DIAGNOSIS — E039 Hypothyroidism, unspecified: Secondary | ICD-10-CM | POA: Insufficient documentation

## 2021-07-12 DIAGNOSIS — R1011 Right upper quadrant pain: Secondary | ICD-10-CM | POA: Diagnosis not present

## 2021-07-12 DIAGNOSIS — E559 Vitamin D deficiency, unspecified: Secondary | ICD-10-CM | POA: Diagnosis not present

## 2021-07-12 DIAGNOSIS — K219 Gastro-esophageal reflux disease without esophagitis: Secondary | ICD-10-CM | POA: Insufficient documentation

## 2021-07-12 DIAGNOSIS — K8000 Calculus of gallbladder with acute cholecystitis without obstruction: Secondary | ICD-10-CM | POA: Diagnosis not present

## 2021-07-12 DIAGNOSIS — J45909 Unspecified asthma, uncomplicated: Secondary | ICD-10-CM | POA: Diagnosis not present

## 2021-07-12 DIAGNOSIS — Z419 Encounter for procedure for purposes other than remedying health state, unspecified: Secondary | ICD-10-CM

## 2021-07-12 HISTORY — PX: CHOLECYSTECTOMY: SHX55

## 2021-07-12 LAB — URINALYSIS, ROUTINE W REFLEX MICROSCOPIC
Bilirubin Urine: NEGATIVE
Glucose, UA: NEGATIVE mg/dL
Ketones, ur: NEGATIVE mg/dL
Nitrite: NEGATIVE
Protein, ur: NEGATIVE mg/dL
Specific Gravity, Urine: 1.013 (ref 1.005–1.030)
pH: 5 (ref 5.0–8.0)

## 2021-07-12 LAB — COMPREHENSIVE METABOLIC PANEL
ALT: 202 U/L — ABNORMAL HIGH (ref 0–44)
AST: 221 U/L — ABNORMAL HIGH (ref 15–41)
Albumin: 4.5 g/dL (ref 3.5–5.0)
Alkaline Phosphatase: 138 U/L — ABNORMAL HIGH (ref 38–126)
Anion gap: 6 (ref 5–15)
BUN: 21 mg/dL — ABNORMAL HIGH (ref 6–20)
CO2: 27 mmol/L (ref 22–32)
Calcium: 10.7 mg/dL — ABNORMAL HIGH (ref 8.9–10.3)
Chloride: 108 mmol/L (ref 98–111)
Creatinine, Ser: 1.26 mg/dL — ABNORMAL HIGH (ref 0.44–1.00)
GFR, Estimated: 51 mL/min — ABNORMAL LOW (ref >60)
Glucose, Bld: 107 mg/dL — ABNORMAL HIGH (ref 70–99)
Potassium: 4.6 mmol/L (ref 3.5–5.1)
Sodium: 141 mmol/L (ref 135–145)
Total Bilirubin: 2.4 mg/dL — ABNORMAL HIGH (ref 0.3–1.2)
Total Protein: 7.8 g/dL (ref 6.5–8.1)

## 2021-07-12 LAB — CBC
HCT: 45.2 % (ref 36.0–46.0)
Hemoglobin: 14.9 g/dL (ref 12.0–15.0)
MCH: 29.3 pg (ref 26.0–34.0)
MCHC: 33 g/dL (ref 30.0–36.0)
MCV: 89 fL (ref 80.0–100.0)
Platelets: 341 10*3/uL (ref 150–400)
RBC: 5.08 MIL/uL (ref 3.87–5.11)
RDW: 14.2 % (ref 11.5–15.5)
WBC: 6 10*3/uL (ref 4.0–10.5)
nRBC: 0 % (ref 0.0–0.2)

## 2021-07-12 LAB — RESP PANEL BY RT-PCR (FLU A&B, COVID) ARPGX2
Influenza A by PCR: NEGATIVE
Influenza B by PCR: NEGATIVE
SARS Coronavirus 2 by RT PCR: NEGATIVE

## 2021-07-12 LAB — LIPASE, BLOOD: Lipase: 38 U/L (ref 11–51)

## 2021-07-12 SURGERY — LAPAROSCOPIC CHOLECYSTECTOMY WITH INTRAOPERATIVE CHOLANGIOGRAM
Anesthesia: General

## 2021-07-12 MED ORDER — DEXAMETHASONE SODIUM PHOSPHATE 10 MG/ML IJ SOLN
INTRAMUSCULAR | Status: AC
  Filled 2021-07-12: qty 1

## 2021-07-12 MED ORDER — AMISULPRIDE (ANTIEMETIC) 5 MG/2ML IV SOLN
10.0000 mg | Freq: Once | INTRAVENOUS | Status: AC | PRN
  Administered 2021-07-12: 10 mg via INTRAVENOUS

## 2021-07-12 MED ORDER — SCOPOLAMINE 1 MG/3DAYS TD PT72
MEDICATED_PATCH | TRANSDERMAL | Status: DC | PRN
  Administered 2021-07-12: 1 via TRANSDERMAL

## 2021-07-12 MED ORDER — FENTANYL CITRATE (PF) 250 MCG/5ML IJ SOLN
INTRAMUSCULAR | Status: AC
  Filled 2021-07-12: qty 5

## 2021-07-12 MED ORDER — SUGAMMADEX SODIUM 200 MG/2ML IV SOLN
INTRAVENOUS | Status: DC | PRN
  Administered 2021-07-12: 200 mg via INTRAVENOUS

## 2021-07-12 MED ORDER — LACTATED RINGERS IV SOLN: INTRAVENOUS | Status: DC | PRN

## 2021-07-12 MED ORDER — FENTANYL CITRATE (PF) 100 MCG/2ML IJ SOLN
INTRAMUSCULAR | Status: DC | PRN
  Administered 2021-07-12: 100 ug via INTRAVENOUS
  Administered 2021-07-12: 50 ug via INTRAVENOUS

## 2021-07-12 MED ORDER — ONDANSETRON HCL 4 MG/2ML IJ SOLN
4.0000 mg | Freq: Once | INTRAMUSCULAR | Status: AC
  Administered 2021-07-12: 4 mg via INTRAVENOUS
  Filled 2021-07-12: qty 2

## 2021-07-12 MED ORDER — LACTATED RINGERS IV SOLN: INTRAVENOUS | Status: DC

## 2021-07-12 MED ORDER — MORPHINE SULFATE (PF) 2 MG/ML IV SOLN: 2.0000 mg | INTRAVENOUS | Status: DC | PRN

## 2021-07-12 MED ORDER — OXYCODONE HCL 5 MG PO TABS
ORAL_TABLET | ORAL | Status: AC
  Filled 2021-07-12: qty 1

## 2021-07-12 MED ORDER — FENTANYL CITRATE PF 50 MCG/ML IJ SOSY
PREFILLED_SYRINGE | INTRAMUSCULAR | Status: AC
  Filled 2021-07-12: qty 2

## 2021-07-12 MED ORDER — SCOPOLAMINE 1 MG/3DAYS TD PT72
MEDICATED_PATCH | TRANSDERMAL | Status: AC
  Filled 2021-07-12: qty 1

## 2021-07-12 MED ORDER — MIDAZOLAM HCL 5 MG/5ML IJ SOLN
INTRAMUSCULAR | Status: DC | PRN
  Administered 2021-07-12: 2 mg via INTRAVENOUS

## 2021-07-12 MED ORDER — ROCURONIUM BROMIDE 10 MG/ML (PF) SYRINGE
PREFILLED_SYRINGE | INTRAVENOUS | Status: DC | PRN
  Administered 2021-07-12: 40 mg via INTRAVENOUS

## 2021-07-12 MED ORDER — AMISULPRIDE (ANTIEMETIC) 5 MG/2ML IV SOLN
INTRAVENOUS | Status: AC
  Filled 2021-07-12: qty 4

## 2021-07-12 MED ORDER — PROPOFOL 10 MG/ML IV BOLUS
INTRAVENOUS | Status: DC | PRN
  Administered 2021-07-12: 150 mg via INTRAVENOUS

## 2021-07-12 MED ORDER — LIDOCAINE HCL (PF) 2 % IJ SOLN
INTRAMUSCULAR | Status: AC
  Filled 2021-07-12: qty 5

## 2021-07-12 MED ORDER — FENTANYL CITRATE PF 50 MCG/ML IJ SOSY
50.0000 ug | PREFILLED_SYRINGE | Freq: Once | INTRAMUSCULAR | Status: AC
  Administered 2021-07-12: 50 ug via INTRAVENOUS
  Filled 2021-07-12: qty 1

## 2021-07-12 MED ORDER — SUCCINYLCHOLINE CHLORIDE 200 MG/10ML IV SOSY
PREFILLED_SYRINGE | INTRAVENOUS | Status: AC
  Filled 2021-07-12: qty 10

## 2021-07-12 MED ORDER — FENTANYL CITRATE PF 50 MCG/ML IJ SOSY
25.0000 ug | PREFILLED_SYRINGE | INTRAMUSCULAR | Status: DC | PRN
  Administered 2021-07-12: 50 ug via INTRAVENOUS
  Administered 2021-07-12 (×2): 25 ug via INTRAVENOUS

## 2021-07-12 MED ORDER — ONDANSETRON HCL 4 MG/2ML IJ SOLN
INTRAMUSCULAR | Status: DC | PRN
  Administered 2021-07-12: 4 mg via INTRAVENOUS

## 2021-07-12 MED ORDER — BUPIVACAINE-EPINEPHRINE (PF) 0.25% -1:200000 IJ SOLN
INTRAMUSCULAR | Status: DC | PRN
  Administered 2021-07-12: 22 mL via PERINEURAL

## 2021-07-12 MED ORDER — IOHEXOL 300 MG/ML  SOLN
INTRAMUSCULAR | Status: DC | PRN
  Administered 2021-07-12: 10 mL

## 2021-07-12 MED ORDER — ROCURONIUM BROMIDE 10 MG/ML (PF) SYRINGE
PREFILLED_SYRINGE | INTRAVENOUS | Status: AC
  Filled 2021-07-12: qty 10

## 2021-07-12 MED ORDER — PHENYLEPHRINE 40 MCG/ML (10ML) SYRINGE FOR IV PUSH (FOR BLOOD PRESSURE SUPPORT)
PREFILLED_SYRINGE | INTRAVENOUS | Status: AC
  Filled 2021-07-12: qty 10

## 2021-07-12 MED ORDER — BUPIVACAINE-EPINEPHRINE (PF) 0.25% -1:200000 IJ SOLN
INTRAMUSCULAR | Status: AC
  Filled 2021-07-12: qty 30

## 2021-07-12 MED ORDER — GABAPENTIN 300 MG PO CAPS
300.0000 mg | ORAL_CAPSULE | ORAL | Status: AC
  Administered 2021-07-12: 300 mg via ORAL
  Filled 2021-07-12: qty 1

## 2021-07-12 MED ORDER — LIDOCAINE 2% (20 MG/ML) 5 ML SYRINGE
INTRAMUSCULAR | Status: DC | PRN
  Administered 2021-07-12: 100 mg via INTRAVENOUS

## 2021-07-12 MED ORDER — MIDAZOLAM HCL 2 MG/2ML IJ SOLN
INTRAMUSCULAR | Status: AC
  Filled 2021-07-12: qty 2

## 2021-07-12 MED ORDER — DEXAMETHASONE SODIUM PHOSPHATE 10 MG/ML IJ SOLN
INTRAMUSCULAR | Status: DC | PRN
  Administered 2021-07-12: 8 mg via INTRAVENOUS

## 2021-07-12 MED ORDER — PROPOFOL 10 MG/ML IV BOLUS
INTRAVENOUS | Status: AC
  Filled 2021-07-12: qty 20

## 2021-07-12 MED ORDER — OXYCODONE HCL 5 MG PO TABS
5.0000 mg | ORAL_TABLET | Freq: Once | ORAL | Status: AC
  Administered 2021-07-12: 5 mg via ORAL

## 2021-07-12 MED ORDER — SODIUM CHLORIDE 0.9 % IV SOLN
2.0000 g | Freq: Once | INTRAVENOUS | Status: AC
  Administered 2021-07-12: 2 g via INTRAVENOUS
  Filled 2021-07-12: qty 20

## 2021-07-12 MED ORDER — ONDANSETRON HCL 4 MG/2ML IJ SOLN
INTRAMUSCULAR | Status: AC
  Filled 2021-07-12: qty 2

## 2021-07-12 MED ORDER — ONDANSETRON HCL 4 MG/2ML IJ SOLN: 4.0000 mg | Freq: Four times a day (QID) | INTRAMUSCULAR | Status: DC | PRN

## 2021-07-12 MED ORDER — ACETAMINOPHEN 500 MG PO TABS
1000.0000 mg | ORAL_TABLET | ORAL | Status: AC
  Administered 2021-07-12: 1000 mg via ORAL
  Filled 2021-07-12: qty 2

## 2021-07-12 MED ORDER — KETOROLAC TROMETHAMINE 15 MG/ML IJ SOLN: 15.0000 mg | INTRAMUSCULAR | Status: DC

## 2021-07-12 MED ORDER — OXYCODONE HCL 5 MG PO TABS: 5.0000 mg | ORAL_TABLET | Freq: Four times a day (QID) | ORAL | 0 refills | Status: AC | PRN

## 2021-07-12 MED ORDER — PHENYLEPHRINE 40 MCG/ML (10ML) SYRINGE FOR IV PUSH (FOR BLOOD PRESSURE SUPPORT)
PREFILLED_SYRINGE | INTRAVENOUS | Status: DC | PRN
  Administered 2021-07-12: 80 ug via INTRAVENOUS
  Administered 2021-07-12: 40 ug via INTRAVENOUS
  Administered 2021-07-12: 80 ug via INTRAVENOUS

## 2021-07-12 SURGICAL SUPPLY — 37 items
APPLIER CLIP 5 13 M/L LIGAMAX5 (MISCELLANEOUS) ×2
BAG COUNTER SPONGE SURGICOUNT (BAG) IMPLANT
CABLE HIGH FREQUENCY MONO STRZ (ELECTRODE) ×2 IMPLANT
CHLORAPREP W/TINT 26 (MISCELLANEOUS) ×2 IMPLANT
CLIP APPLIE 5 13 M/L LIGAMAX5 (MISCELLANEOUS) ×1 IMPLANT
CLIP LIGATING HEMO O LOK GREEN (MISCELLANEOUS) ×2 IMPLANT
COVER MAYO STAND STRL (DRAPES) ×2 IMPLANT
COVER TRANSDUCER ULTRASND (DRAPES) IMPLANT
DECANTER SPIKE VIAL GLASS SM (MISCELLANEOUS) ×2 IMPLANT
DERMABOND ADVANCED (GAUZE/BANDAGES/DRESSINGS) ×1
DERMABOND ADVANCED .7 DNX12 (GAUZE/BANDAGES/DRESSINGS) ×1 IMPLANT
DRAPE C-ARM 42X120 X-RAY (DRAPES) ×2 IMPLANT
ELECT REM PT RETURN 15FT ADLT (MISCELLANEOUS) ×2 IMPLANT
ENDOLOOP SUT PDS II  0 18 (SUTURE) ×2
ENDOLOOP SUT PDS II 0 18 (SUTURE) ×1 IMPLANT
GAUZE SPONGE 2X2 8PLY STRL LF (GAUZE/BANDAGES/DRESSINGS) ×1 IMPLANT
GLOVE SURG ENC MOIS LTX SZ7.5 (GLOVE) ×2 IMPLANT
GOWN STRL REUS W/TWL XL LVL3 (GOWN DISPOSABLE) ×6 IMPLANT
GRASPER SUT TROCAR 14GX15 (MISCELLANEOUS) ×2 IMPLANT
IRRIG SUCT STRYKERFLOW 2 WTIP (MISCELLANEOUS) ×2
IRRIGATION SUCT STRKRFLW 2 WTP (MISCELLANEOUS) ×1 IMPLANT
KIT BASIN OR (CUSTOM PROCEDURE TRAY) ×2 IMPLANT
KIT TURNOVER KIT A (KITS) IMPLANT
NEEDLE INSUFFLATION 14GA 120MM (NEEDLE) ×2 IMPLANT
PENCIL SMOKE EVACUATOR (MISCELLANEOUS) IMPLANT
POUCH RETRIEVAL ECOSAC 10 (ENDOMECHANICALS) ×1 IMPLANT
POUCH RETRIEVAL ECOSAC 10MM (ENDOMECHANICALS) ×2
SCISSORS LAP 5X35 DISP (ENDOMECHANICALS) ×2 IMPLANT
SET CHOLANGIOGRAPH MIX (MISCELLANEOUS) ×2 IMPLANT
SET TUBE SMOKE EVAC HIGH FLOW (TUBING) ×2 IMPLANT
SLEEVE XCEL OPT CAN 5 100 (ENDOMECHANICALS) ×4 IMPLANT
SPONGE GAUZE 2X2 STER 10/PKG (GAUZE/BANDAGES/DRESSINGS) ×1
SUT MNCRL AB 4-0 PS2 18 (SUTURE) ×2 IMPLANT
TOWEL OR 17X26 10 PK STRL BLUE (TOWEL DISPOSABLE) ×2 IMPLANT
TRAY LAPAROSCOPIC (CUSTOM PROCEDURE TRAY) ×2 IMPLANT
TROCAR BLADELESS OPT 5 100 (ENDOMECHANICALS) ×2 IMPLANT
TROCAR XCEL NON-BLD 11X100MML (ENDOMECHANICALS) ×2 IMPLANT

## 2021-07-12 NOTE — Op Note (Signed)
07/12/2021  2:56 PM  PATIENT:  Lynnell Catalan  55 y.o. female  PRE-OPERATIVE DIAGNOSIS:  ACUTE CHOLECYSTITIS  POST-OPERATIVE DIAGNOSIS:  ACUTE CHOLECYSTITIS  PROCEDURE:  Procedure(s): LAPAROSCOPIC CHOLECYSTECTOMY WITH INTRAOPERATIVE CHOLANGIOGRAM (N/A)  SURGEON:  Surgeon(s) and Role:    Ralene Ok, MD - Primary :   ASSISTANTS: none   ANESTHESIA:   local and general  EBL:  minimal   BLOOD ADMINISTERED:none  DRAINS: none   LOCAL MEDICATIONS USED:  BUPIVICAINE   SPECIMEN:  Source of Specimen:  gallbladder  DISPOSITION OF SPECIMEN:  PATHOLOGY  COUNTS:  YES  TOURNIQUET:  * No tourniquets in log *  DICTATION: .Dragon Dictation Indication procedure: Patient is a 55 year old female came in secondary to epigastric/right upper quadrant abdominal pain.  Patient was worked up in the ER was found to have gallstones.  Patient with elevated LFTs.  Patient was taken back to the operating room urgently for laparoscopic cholecystectomy with IOC.  Findings: Patient with large gallstone within the gallbladder.  Patient with IOC that was normal with easy contrast exiting into the duodenum without any filling defects, hepatic ducts were seen to be without filling defects.  Details of procedure:  The patient was taken to the operating and placed in the supine position with bilateral SCDs in place.  The patient was prepped and draped in the usual sterile fashion. A time out was called and all facts were verified. A pneumoperitoneum was obtained via A Veress needle technique to a pressure of 73mm of mercury.  A 53mm trochar was then placed in the right upper quadrant under visualization, and there were no injuries to any abdominal organs. A 11 mm port was then placed in the umbilical region after infiltrating with local anesthesia under direct visualization. A second and third epigastric port and right lower quadrant port placement under direct visualization, respectively.      The gallbladder was identified and retracted, the peritoneum was then sharply dissected from the gallbladder and this dissection was carried down to Calot's triangle. The cystic duct was identified and stripped away circumferentially and seen going into the gallbladder 360, the critical angle was obtained. A Cook catheter was used to perform an intraoperative cholangiogram. The cholangiogram showed no filling defects and the contrast emptied into the duodenum easily.  The hepatic ducts were seen to be free of any filling defects. 2 clips were placed proximally one distally and the cystic duct transected.  An Endoloop was placed at the base as this was a very dilated duct.  The cystic artery was identified and 2 clips placed proximally and one distally and transected.    We then proceeded to remove the gallbladder off the hepatic fossa with Bovie cautery. A retrieval bag was then placed in the abdomen and gallbladder placed in the bag. The hepatic fossa was then reexamined and hemostasis was achieved with Bovie cautery and was excellent at the end of the case. The subhepatic fossa and perihepatic fossa was then irrigated until the effluent was clear. The 11 mm trocar fascia was reapproximated with the PMI & a #1 Vicryl x2.  The pneumoperitoneum was evacuated and all trochars removed under direct visulalization.  The skin was then closed with 4-0 Monocryl and the skin dressed with Dermabond.  The patient was awaken from general anesthesia and taken to the recovery room in stable condition.    PLAN OF CARE: Discharge to home after PACU  PATIENT DISPOSITION:  PACU - hemodynamically stable.   Delay start of Pharmacological  VTE agent (>24hrs) due to surgical blood loss or risk of bleeding: not applicable

## 2021-07-12 NOTE — H&P (Signed)
[facility] Admission Note  Sara Nichols Dec 05, 1965  [id number].    Requesting MD: Davonna Belling Chief Complaint/Reason for Consult: gallstones  HPI:  Sara Nichols is a 55yo female PMH hypothyroidism, GERD, and asthma who presented to [facility] earlier today complaining of acute onset RUQ abdominal pain. States that that the pain started after eating chicken wings last night. Pain radiates into her back and is associated with nausea, no emesis. She reports similar symptoms a couple weeks ago. At that time she had nausea and vomiting. She saw her PCP and was scheduled for an outpatient u/s. Denies fever or chills.  In the ED patient underwent u/s which shows cholelithiasis without sonographic evidence of acute cholecystitis, common bile duct 0.3cm. WBC 6. AST 221, ALT 202, Alk phos 138, Tbili 2.4, lipase WNL. General surgery asked to see. Last oral intake was last night prior to MN.   Abdominal surgical history: none Anticoagulants: none Nonsmoker Drinks alcohol occassionally Denies illicit drug use  Review of Systems  Constitutional: Negative.   Gastrointestinal:  Positive for abdominal pain and nausea. Negative for vomiting.  Musculoskeletal:  Positive for back pain.   All systems reviewed and otherwise negative except for as above  Family History  Problem Relation Age of Onset   Thyroid disease Mother    Asthma Father        uses inhaler   Cancer Father        prostate & blood   Hypertension Sister    Breast cancer Paternal Grandmother    Colon cancer Maternal Grandfather 30   Esophageal cancer Neg Hx    Rectal cancer Neg Hx    Stomach cancer Neg Hx     Past Medical History:  Diagnosis Date   Asthma    Dyspnea    GERD (gastroesophageal reflux disease)    Hyperlipidemia    Hypothyroidism    Mild vitamin D deficiency     Past Surgical History:  Procedure Laterality Date   CARPAL TUNNEL RELEASE Bilateral 07/2016, 07/2017    COLONOSCOPY  11/17/2020   TMJ ARTHROPLASTY     TRANSFORAMINAL LUMBAR INTERBODY FUSION (TLIF) WITH PEDICLE SCREW FIXATION 1 LEVEL Right 11/07/2019   Procedure: RIGHT-SIDED LUMBAR 4-5 TRANSFORAMINAL LUMBAR INTERBODY FUSION WITH INSTRUMENTATION AND ALLOGRAFT;  Surgeon: Phylliss Bob, MD;  Location: [facility];  Service: Orthopedics;  Laterality: Right;    Social History:  reports that she has never smoked. She has never used smokeless tobacco. She reports that she does not currently use alcohol. She reports that she does not use drugs.  Allergies: No Known Allergies  (Not in a hospital admission)   Prior to Admission medications   Medication Sig Start Date End Date Taking? Authorizing Provider  acetaminophen (TYLENOL) 500 MG tablet Take 1,000 mg by mouth every 6 (six) hours as needed for mild pain.   Yes [provider]  budesonide-formoterol (SYMBICORT) 160-4.5 MCG/ACT inhaler Inhale 2 puffs into the lungs 2 (two) times daily.    Yes [provider]  famotidine (PEPCID) 40 MG tablet Take 40 mg by mouth 2 (two) times daily. 07/13/18  Yes [provider]  levothyroxine (SYNTHROID) 75 MCG tablet Take 75 mcg by mouth daily before breakfast.  11/07/12  Yes [provider]  Magnesium 500 MG TABS Take 500 mg by mouth daily with supper. 07/11/16  Yes [provider]  methocarbamol (ROBAXIN) 500 MG tablet Take 500 mg by mouth daily as needed for muscle spasms. 06/23/21  Yes [provider]  Potassium 99 MG TABS Take 99 mg by mouth daily. 07/11/16  Yes [provider]  Probiotic TBEC Take 1 tablet by mouth daily. 07/11/16  Yes [provider]  valACYclovir (VALTREX) 1000 MG tablet Take 1 tablet (1,000 mg total) by mouth daily as needed. Patient taking differently: Take 1,000 mg by mouth daily as needed (fever blister). 01/11/18  Yes Megan Salon, MD    Blood pressure (!) 155/92, pulse 88, temperature 97.9 F (36.6 C), temperature  source Oral, resp. rate 16, height 5' (1.524 m), weight 63.5 kg, last menstrual period 10/20/2016, SpO2 99 %. Physical Exam: General: pleasant, WD/WN female who is laying in bed in NAD HEENT: head is normocephalic, atraumatic.  Sclera are noninjected.  Pupils equal and round, EOMs intact.  Ears and nose without any masses or lesions.  Mouth is pink and moist.  Heart: regular, rate, and rhythm.  Normal s1,s2. No obvious murmurs, gallops, or rubs noted.  Palpable pedal pulses bilaterally  Lungs: CTAB, no wheezes, rhonchi, or rales noted.  Respiratory effort nonlabored Abd: soft, ND, TTP RUQ without rebound or guarding, +BS, no masses, hernias, or organomegaly MS: no BUE/BLE edema, calves soft and nontender Skin: warm and dry with no masses, lesions, or rashes Psych: A&Ox4 with an appropriate affect Neuro: cranial nerves grossly intact, equal strength in BUE/BLE bilaterally, normal speech, thought process intact  Results for orders placed or performed during the hospital encounter of 07/12/21 (from the past 48 hour(s))  Lipase, blood     Status: None   Collection Time: 07/12/21  9:52 AM  Result Value Ref Range   Lipase 38 11 - 51 U/L    Comment: Performed at [facility], [place] [address], [place], [place] [address]  Comprehensive metabolic panel     Status: Abnormal   Collection Time: 07/12/21  9:52 AM  Result Value Ref Range   Sodium 141 135 - 145 mmol/L   Potassium 4.6 3.5 - 5.1 mmol/L   Chloride 108 98 - 111 mmol/L   CO2 27 22 - 32 mmol/L   Glucose, Bld 107 (H) 70 - 99 mg/dL    Comment: Glucose reference range applies only to samples taken after fasting for at least 8 hours.   BUN 21 (H) 6 - 20 mg/dL   Creatinine, Ser 1.26 (H) 0.44 - 1.00 mg/dL   Calcium 10.7 (H) 8.9 - 10.3 mg/dL   Total Protein 7.8 6.5 - 8.1 g/dL   Albumin 4.5 3.5 - 5.0 g/dL   AST 221 (H) 15 - 41 U/L   ALT 202 (H) 0 - 44 U/L   Alkaline Phosphatase 138 (H) 38 - 126 U/L   Total Bilirubin 2.4 (H)  0.3 - 1.2 mg/dL   GFR, Estimated 51 (L) >60 mL/min    Comment: (NOTE) Calculated using the CKD-EPI Creatinine Equation (2021)    Anion gap 6 5 - 15    Comment: Performed at [facility], [place] [address] [place], [place] [address]  CBC     Status: None   Collection Time: 07/12/21  9:52 AM  Result Value Ref Range   WBC 6.0 4.0 - 10.5 K/uL   RBC 5.08 3.87 - 5.11 MIL/uL   Hemoglobin 14.9 12.0 - 15.0 g/dL   HCT 45.2 36.0 - 46.0 %   MCV 89.0 80.0 - 100.0 fL   MCH 29.3 26.0 - 34.0 pg   MCHC 33.0 30.0 - 36.0 g/dL   RDW 14.2 11.5 - 15.5 %  Platelets 341 150 - 400 K/uL   nRBC 0.0 0.0 - 0.2 %    Comment: Performed at [facility], [place] [address], [place], [place] [address]  Urinalysis, Routine w reflex microscopic Urine, Clean Catch     Status: Abnormal   Collection Time: 07/12/21 11:59 AM  Result Value Ref Range   Color, Urine YELLOW YELLOW   APPearance CLEAR CLEAR   Specific Gravity, Urine 1.013 1.005 - 1.030   pH 5.0 5.0 - 8.0   Glucose, UA NEGATIVE NEGATIVE mg/dL   Hgb urine dipstick SMALL (A) NEGATIVE   Bilirubin Urine NEGATIVE NEGATIVE   Ketones, ur NEGATIVE NEGATIVE mg/dL   Protein, ur NEGATIVE NEGATIVE mg/dL   Nitrite NEGATIVE NEGATIVE   Leukocytes,Ua SMALL (A) NEGATIVE   RBC / HPF 0-5 0 - 5 RBC/hpf   WBC, UA 6-10 0 - 5 WBC/hpf   Bacteria, UA RARE (A) NONE SEEN   Squamous Epithelial / LPF 0-5 0 - 5   Mucus PRESENT    Hyaline Casts, UA PRESENT     Comment: Performed at [facility], [place] [address], [place], [place] [address]   US Abdomen Limited RUQ (LIVER/GB)  Result Date: 07/12/2021 CLINICAL DATA:  RIGHT upper quadrant pain. EXAM: ULTRASOUND ABDOMEN LIMITED RIGHT UPPER QUADRANT COMPARISON:  Chest XR, 01/18/2013. FINDINGS: Gallbladder: Nondistended. Dependent, shadowing gallstone measuring up to 2.4 cm. Gallbladder wall measures near the upper limit of normal in thickness, at 0.3 cm. No sonographic Murphy sign noted  by sonographer. Common bile duct: Diameter: 0.3 cm Liver: Rounded, homogeneously echogenic LEFT hepatic lobe lesion measuring 0.9 x 1.0 x 0.9 cm. Within normal limits in parenchymal echogenicity. Portal vein is patent on color Doppler imaging with normal direction of blood flow towards the liver. Other: No perihepatic ascites. IMPRESSION: 1. Cholelithiasis without sonographic evidence of acute cholecystitis. 2. 1.0 cm rounded LEFT hepatic lobe lesion. Findings likely to represent a small hepatic hemangioma. Electronically Signed   By: Michaelle Birks M.D.   On: 07/12/2021 10:02      Assessment/Plan Symptomatic cholelithiasis, possible early acute cholecystitis Elevated LFTs - Patient with symptomatic cholelithiasis, possible early acute cholecystitis. Her LFTs are mildly elevated. Will plan for laparoscopic cholecystectomy with IOC today. Keep NPO. Start IV rocephin. Covid test is pending. She may be able to go home later today after surgery.  I have explained the procedure, risks, and aftercare of cholecystectomy.  Risks include but are not limited to bleeding, infection, wound problems, anesthesia, diarrhea, bile leak, injury to common bile duct/liver/intestine.  She seems to understand and agrees to proceed.   Richard Miu, [facility] 07/12/2021, 12:54 PM Please see Amion for pager number during day hours 7:00am-4:30pm

## 2021-07-12 NOTE — Anesthesia Postprocedure Evaluation (Signed)
Anesthesia Post Note  Patient: Sara Nichols  Procedure(s) Performed: LAPAROSCOPIC CHOLECYSTECTOMY WITH INTRAOPERATIVE CHOLANGIOGRAM     Patient location during evaluation: PACU Anesthesia Type: General Level of consciousness: awake and alert Pain management: pain level controlled Vital Signs Assessment: post-procedure vital signs reviewed and stable Respiratory status: spontaneous breathing, nonlabored ventilation, respiratory function stable and patient connected to nasal cannula oxygen Cardiovascular status: blood pressure returned to baseline and stable Postop Assessment: no apparent nausea or vomiting Anesthetic complications: no   No notable events documented.  Last Vitals:  Vitals:   07/12/21 1600 07/12/21 1615  BP: (!) 146/92 (!) 143/92  Pulse: 78 76  Resp: 14 15  Temp:  36.7 C  SpO2: 95% 95%    Last Pain:  Vitals:   07/12/21 1615  TempSrc:   PainSc: [name]

## 2021-07-12 NOTE — ED Triage Notes (Signed)
Patient c/o RUQ pain that radiates into the back x 2 weeks.  Patient states she is schedule for an [place] in 2 days.

## 2021-07-12 NOTE — Anesthesia Procedure Notes (Signed)
Procedure Name: Intubation Date/Time: 07/12/2021 2:20 PM Performed by: Lavina Hamman, CRNA Pre-anesthesia Checklist: Patient identified, Emergency Drugs available, Suction available, Patient being monitored and Timeout performed Patient Re-evaluated:Patient Re-evaluated prior to induction Oxygen Delivery Method: Circle system utilized Preoxygenation: Pre-oxygenation with 100% oxygen Induction Type: IV induction and Cricoid Pressure applied Ventilation: Mask ventilation without difficulty Laryngoscope Size: Mac and 3 Grade View: Grade I Tube type: Oral Tube size: 7.5 mm Number of attempts: 1 Airway Equipment and Method: Stylet Placement Confirmation: ETT inserted through vocal cords under direct vision, positive ETCO2, CO2 detector and breath sounds checked- equal and bilateral Secured at: 21 cm Tube secured with: Tape Dental Injury: Teeth and Oropharynx as per pre-operative assessment  Comments: ATOI

## 2021-07-12 NOTE — Transfer of Care (Signed)
Immediate Anesthesia Transfer of Care Note  Patient: Sara Nichols  Procedure(s) Performed: LAPAROSCOPIC CHOLECYSTECTOMY WITH INTRAOPERATIVE CHOLANGIOGRAM  Patient Location: PACU  Anesthesia Type:General  Level of Consciousness: drowsy  Airway & Oxygen Therapy: Patient Spontanous Breathing and Patient connected to face mask oxygen  Post-op Assessment: Report given to RN and Post -op Vital signs reviewed and stable  Post vital signs: Reviewed and stable  Last Vitals:  Vitals Value Taken Time  BP 146/96 07/12/21 1519  Temp    Pulse 77 07/12/21 1523  Resp 12 07/12/21 1523  SpO2 100 % 07/12/21 1523  Vitals shown include unvalidated device data.  Last Pain:  Vitals:   07/12/21 1346  TempSrc:   PainSc: 0-No pain         Complications: No notable events documented.

## 2021-07-12 NOTE — Anesthesia Preprocedure Evaluation (Signed)
Anesthesia Evaluation  Patient identified by MRN, date of birth, ID band Patient awake    Reviewed: Allergy & Precautions, H&P , NPO status , Patient's Chart, lab work & pertinent test results  Airway Mallampati: II  TM Distance: >3 FB Neck ROM: Full    Dental  (+) Dental Advisory Given   Pulmonary asthma ,    breath sounds clear to auscultation       Cardiovascular negative cardio ROS   Rhythm:Regular Rate:Normal     Neuro/Psych  Neuromuscular disease    GI/Hepatic GERD  ,Elevated LFT's   Endo/Other  Hypothyroidism   Renal/GU Renal InsufficiencyRenal disease     Musculoskeletal   Abdominal   Peds  Hematology negative hematology ROS (+)   Anesthesia Other Findings   Reproductive/Obstetrics                             Lab Results  Component Value Date   WBC 6.0 07/12/2021   HGB 14.9 07/12/2021   HCT 45.2 07/12/2021   MCV 89.0 07/12/2021   PLT 341 07/12/2021   Lab Results  Component Value Date   CREATININE 1.26 (H) 07/12/2021   BUN 21 (H) 07/12/2021   NA 141 07/12/2021   K 4.6 07/12/2021   CL 108 07/12/2021   CO2 27 07/12/2021    Anesthesia Physical Anesthesia Plan  ASA: 2  Anesthesia Plan: General   Post-op Pain Management: Gabapentin PO (pre-op) and Minimal or no pain anticipated   Induction: Intravenous  PONV Risk Score and Plan: 4 or greater and Dexamethasone, Ondansetron, Midazolam, Treatment may vary due to age or medical condition and Scopolamine patch - Pre-op  Airway Management Planned: Oral ETT  Additional Equipment:   Intra-op Plan:   Post-operative Plan: Extubation in OR  Informed Consent: I have reviewed the patients History and Physical, chart, labs and discussed the procedure including the risks, benefits and alternatives for the proposed anesthesia with the patient or authorized representative who has indicated his/her understanding and acceptance.      Dental advisory given  Plan Discussed with: CRNA  Anesthesia Plan Comments:         Anesthesia Quick Evaluation

## 2021-07-12 NOTE — Discharge Instructions (Addendum)
[facility]  Please arrive at least 30 min before your appointment to complete your check in paperwork.  If you are unable to arrive 30 min prior to your appointment time we may have to cancel or reschedule you. LAPAROSCOPIC SURGERY: POST OP INSTRUCTIONS Always review your discharge instruction sheet given to you by the facility where your surgery was performed. IF YOU HAVE DISABILITY OR FAMILY LEAVE FORMS, YOU MUST BRING THEM TO THE OFFICE FOR PROCESSING.   DO NOT GIVE THEM TO YOUR DOCTOR.  PAIN CONTROL  First take acetaminophen (Tylenol) AND/or ibuprofen (Advil) to control your pain after surgery.  Follow directions on package.  Taking acetaminophen (Tylenol) and/or ibuprofen (Advil) regularly after surgery will help to control your pain and lower the amount of prescription pain medication you may need.  You should not take more than 4,000 mg (4 grams) of acetaminophen (Tylenol) in 24 hours.  You should not take ibuprofen (Advil), aleve, motrin, naprosyn or other NSAIDS if you have a history of stomach ulcers or chronic kidney disease.  A prescription for pain medication may be given to you upon discharge.  Take your pain medication as prescribed, if you still have uncontrolled pain after taking acetaminophen (Tylenol) or ibuprofen (Advil). Use ice packs to help control pain. If you need a refill on your pain medication, please contact your pharmacy.  They will contact our office to request authorization. Prescriptions will not be filled after 5pm or on week-ends.  HOME MEDICATIONS Take your usually prescribed medications unless otherwise directed.  DIET You should follow a light diet the first few days after arrival home.  Be sure to include lots of fluids daily. Avoid fatty, fried foods.   CONSTIPATION It is common to experience some constipation after surgery and if you are taking pain medication.  Increasing fluid intake and taking a stool softener (such as Colace)  will usually help or prevent this problem from occurring.  A mild laxative (Milk of Magnesia or Miralax) should be taken according to package instructions if there are no bowel movements after 48 hours.  WOUND/INCISION CARE Most patients will experience some swelling and bruising in the area of the incisions.  Ice packs will help.  Swelling and bruising can take several days to resolve.  Unless discharge instructions indicate otherwise, follow guidelines below  STERI-STRIPS - you may remove your outer bandages 48 hours after surgery, and you may shower at that time.  You have steri-strips (small skin tapes) in place directly over the incision.  These strips should be left on the skin for 7-10 days.   DERMABOND/SKIN GLUE - you may shower in 24 hours.  The glue will flake off over the next 2-3 weeks. Any sutures or staples will be removed at the office during your follow-up visit.  ACTIVITIES You may resume regular (light) daily activities beginning the next day--such as daily self-care, walking, climbing stairs--gradually increasing activities as tolerated.  You may have sexual intercourse when it is comfortable.  Refrain from any heavy lifting or straining until approved by your doctor. You may drive when you are no longer taking prescription pain medication, you can comfortably wear a seatbelt, and you can safely maneuver your car and apply brakes.  FOLLOW-UP You should see your doctor in the office for a follow-up appointment approximately 2-3 weeks after your surgery.  You should have been given your post-op/follow-up appointment when your surgery was scheduled.  If you did not receive a post-op/follow-up appointment, make sure   that you call for this appointment within a day or two after you arrive home to insure a convenient appointment time.  OTHER INSTRUCTIONS  WHEN TO CALL YOUR DOCTOR: Fever over 101.0 Inability to urinate Continued bleeding from incision. Increased pain, redness, or  drainage from the incision. Increasing abdominal pain  The clinic staff is available to answer your questions during regular business hours.  Please don't hesitate to call and ask to speak to one of the nurses for clinical concerns.  If you have a medical emergency, go to the nearest emergency room or call 911.  A surgeon from [facility] is always on call at the hospital. [address], [address], [place], [place]  [address] ? [address] [address], [place], [place]   [address] [phone] ? [phone] ? [phone]   

## 2021-07-12 NOTE — ED Provider Notes (Signed)
[facility] DEPT Provider Note   CSN: [id number] Arrival date & time: 07/12/21  0840     History Chief Complaint  Patient presents with   Abdominal Pain    Sara Nichols is a 55 y.o. female.   Abdominal Pain Associated symptoms: nausea and vomiting   Associated symptoms: no chest pain and no shortness of breath   Patient presents with abdominal pain.  Starts in the epigastric to right upper quadrant and goes to back.  Severe episode starting last night.  Although has had 1 episode couple weeks ago.  That episode involve nausea and vomiting.  This was mild nausea but no vomiting.  Still having bowel movements.  Has seen PCP for other episode but is scheduled for an ultrasound which was due to be done in 2 days.  No blood in the stool.  States she had been vomiting clear liquid.  States it looked like moonshine.  States she does not drink alcohol heavily.  No fevers or chills.  Patient ate chicken wings last night and has had pain since.Of note is also the patient's birthday today    Past Medical History:  Diagnosis Date   Asthma    Dyspnea    GERD (gastroesophageal reflux disease)    Hyperlipidemia    Hypothyroidism    Mild vitamin D deficiency     Patient Active Problem List   Diagnosis Date Noted   Radiculopathy 11/07/2019   Asthma    GERD (gastroesophageal reflux disease)    Hyperlipidemia    Hypothyroidism     Past Surgical History:  Procedure Laterality Date   CARPAL TUNNEL RELEASE Bilateral 07/2016, 07/2017   COLONOSCOPY  11/17/2020   TMJ ARTHROPLASTY     TRANSFORAMINAL LUMBAR INTERBODY FUSION (TLIF) WITH PEDICLE SCREW FIXATION 1 LEVEL Right 11/07/2019   Procedure: RIGHT-SIDED LUMBAR 4-5 TRANSFORAMINAL LUMBAR INTERBODY FUSION WITH INSTRUMENTATION AND ALLOGRAFT;  Surgeon: Phylliss Bob, MD;  Location: [facility];  Service: Orthopedics;  Laterality: Right;     OB History     Gravida  0   Para  0   Term  0   Preterm  0    AB  0   Living  0      SAB  0   IAB  0   Ectopic  0   Multiple  0   Live Births              Family History  Problem Relation Age of Onset   Thyroid disease Mother    Asthma Father        uses inhaler   Cancer Father        prostate & blood   Hypertension Sister    Breast cancer Paternal Grandmother    Colon cancer Maternal Grandfather 53   Esophageal cancer Neg Hx    Rectal cancer Neg Hx    Stomach cancer Neg Hx     Social History   Tobacco Use   Smoking status: Never   Smokeless tobacco: Never  Vaping Use   Vaping Use: Never used  Substance Use Topics   Alcohol use: Not Currently   Drug use: No    Home Medications Prior to Admission medications   Medication Sig Start Date End Date Taking? Authorizing Provider  acetaminophen (TYLENOL) 500 MG tablet Take 1,000 mg by mouth every 6 (six) hours as needed for mild pain.   Yes [provider]  budesonide-formoterol (SYMBICORT) 160-4.5 MCG/ACT inhaler Inhale 2 puffs into  the lungs 2 (two) times daily.    Yes [provider]  famotidine (PEPCID) 40 MG tablet Take 40 mg by mouth 2 (two) times daily. 07/13/18  Yes [provider]  levothyroxine (SYNTHROID) 75 MCG tablet Take 75 mcg by mouth daily before breakfast.  11/07/12  Yes [provider]  Magnesium 500 MG TABS Take 500 mg by mouth daily with supper. 07/11/16  Yes [provider]  methocarbamol (ROBAXIN) 500 MG tablet Take 500 mg by mouth daily as needed for muscle spasms. 06/23/21  Yes [provider]  Potassium 99 MG TABS Take 99 mg by mouth daily. 07/11/16  Yes [provider]  Probiotic TBEC Take 1 tablet by mouth daily. 07/11/16  Yes [provider]  valACYclovir (VALTREX) 1000 MG tablet Take 1 tablet (1,000 mg total) by mouth daily as needed. Patient taking differently: Take 1,000 mg by mouth daily as needed (fever blister). 01/11/18  Yes Megan Salon, MD    Allergies    Patient  has no known allergies.  Review of Systems   Review of Systems  Constitutional:  Positive for appetite change.  HENT:  Negative for congestion.   Respiratory:  Negative for shortness of breath.   Cardiovascular:  Negative for chest pain.  Gastrointestinal:  Positive for abdominal pain, nausea and vomiting.  Genitourinary:  Negative for flank pain.  Musculoskeletal:  Positive for back pain.  Skin:  Negative for rash.  Neurological:  Negative for weakness.  Psychiatric/Behavioral:  Negative for confusion.    Physical Exam Updated Vital Signs BP (!) 155/92   Pulse 88   Temp 97.9 F (36.6 C) (Oral)   Resp 16   Ht 5' (1.524 m)   Wt 63.5 kg   LMP 10/20/2016 (Approximate) Comment: Menopasual  SpO2 99%   BMI 27.34 kg/m   Physical Exam Vitals and nursing note reviewed.  HENT:     Head: Normocephalic.  Cardiovascular:     Rate and Rhythm: Normal rate and regular rhythm.  Pulmonary:     Breath sounds: Normal breath sounds.  Abdominal:     Hernia: No hernia is present.     Comments: Epigastric right upper quadrant tenderness.  No rebound or guarding.  No hernia palpated.  Skin:    General: Skin is warm.     Capillary Refill: Capillary refill takes less than 2 seconds.  Neurological:     Mental Status: She is alert and oriented to person, place, and time.  Psychiatric:        Mood and Affect: Mood normal.    ED Results / Procedures / Treatments   Labs (all labs ordered are listed, but only abnormal results are displayed) Labs Reviewed  COMPREHENSIVE METABOLIC PANEL - Abnormal; Notable for the following components:      Result Value   Glucose, Bld 107 (*)    BUN 21 (*)    Creatinine, Ser 1.26 (*)    Calcium 10.7 (*)    AST 221 (*)    ALT 202 (*)    Alkaline Phosphatase 138 (*)    Total Bilirubin 2.4 (*)    GFR, Estimated 51 (*)    All other components within normal limits  URINALYSIS, ROUTINE W REFLEX MICROSCOPIC - Abnormal; Notable for the following components:    Hgb urine dipstick SMALL (*)    Leukocytes,Ua SMALL (*)    Bacteria, UA RARE (*)    All other components within normal limits  RESP PANEL BY RT-PCR (FLU A&B, COVID)  ARPGX2  LIPASE, BLOOD  CBC    EKG None  Radiology US Abdomen Limited RUQ (LIVER/GB)  Result Date: 07/12/2021 CLINICAL DATA:  RIGHT upper quadrant pain. EXAM: ULTRASOUND ABDOMEN LIMITED RIGHT UPPER QUADRANT COMPARISON:  Chest XR, 01/18/2013. FINDINGS: Gallbladder: Nondistended. Dependent, shadowing gallstone measuring up to 2.4 cm. Gallbladder wall measures near the upper limit of normal in thickness, at 0.3 cm. No sonographic Murphy sign noted by sonographer. Common bile duct: Diameter: 0.3 cm Liver: Rounded, homogeneously echogenic LEFT hepatic lobe lesion measuring 0.9 x 1.0 x 0.9 cm. Within normal limits in parenchymal echogenicity. Portal vein is patent on color Doppler imaging with normal direction of blood flow towards the liver. Other: No perihepatic ascites. IMPRESSION: 1. Cholelithiasis without sonographic evidence of acute cholecystitis. 2. 1.0 cm rounded LEFT hepatic lobe lesion. Findings likely to represent a small hepatic hemangioma. Electronically Signed   By: Michaelle Birks M.D.   On: 07/12/2021 10:02    Procedures Procedures   Medications Ordered in ED Medications  fentaNYL (SUBLIMAZE) injection 50 mcg (has no administration in time range)  ondansetron (ZOFRAN) injection 4 mg (has no administration in time range)  gabapentin (NEURONTIN) capsule 300 mg (has no administration in time range)  acetaminophen (TYLENOL) tablet 1,000 mg (has no administration in time range)  ketorolac (TORADOL) 15 MG/ML injection 15 mg (has no administration in time range)  cefTRIAXone (ROCEPHIN) 2 g in sodium chloride 0.9 % 100 mL IVPB (has no administration in time range)  lactated ringers infusion (has no administration in time range)  morphine 2 MG/ML injection 2 mg (has no administration in time range)  ondansetron (ZOFRAN)  injection 4 mg (has no administration in time range)    ED Course  I have reviewed the triage vital signs and the nursing notes.  Pertinent labs & imaging results that were available during my care of the patient were reviewed by me and considered in my medical decision making (see chart for details).    MDM Rules/Calculators/A&P                           Patient presents with right upper quadrant abdominal pain.  Began last night.  Similar to episode she had 2 weeks ago.  LFTs elevated.  Epigastric right upper quadrant tenderness.  Ultrasound shows cholelithiasis without cholecystitis, however with the LFTs elevated continued pain discussed with general surgery who appears able to take to the OR. Final Clinical Impression(s) / ED Diagnoses Final diagnoses:  RUQ pain  Symptomatic cholelithiasis    Rx / DC Orders ED Discharge Orders     None        Davonna Belling, MD 07/12/21 1313

## 2021-07-12 NOTE — ED Notes (Signed)
Short stay here to get pt at this time.

## 2021-07-13 ENCOUNTER — Encounter (HOSPITAL_COMMUNITY): Payer: Self-pay | Admitting: General Surgery

## 2021-07-14 ENCOUNTER — Other Ambulatory Visit: Payer: BC Managed Care – PPO

## 2021-07-14 LAB — SURGICAL PATHOLOGY

## 2021-08-12 ENCOUNTER — Other Ambulatory Visit (HOSPITAL_COMMUNITY)
Admission: RE | Admit: 2021-08-12 | Discharge: 2021-08-12 | Disposition: A | Discharge status: Home / Self Care | Payer: BC Managed Care – PPO | Source: Ambulatory Visit | Attending: Obstetrics & Gynecology | Admitting: Obstetrics & Gynecology

## 2021-08-12 ENCOUNTER — Encounter (HOSPITAL_BASED_OUTPATIENT_CLINIC_OR_DEPARTMENT_OTHER): Payer: Self-pay | Admitting: Obstetrics & Gynecology

## 2021-08-12 ENCOUNTER — Other Ambulatory Visit: Payer: Self-pay

## 2021-08-12 ENCOUNTER — Ambulatory Visit (INDEPENDENT_AMBULATORY_CARE_PROVIDER_SITE_OTHER): Payer: BC Managed Care – PPO | Admitting: Obstetrics & Gynecology

## 2021-08-12 VITALS — BP 142/97 | HR 88 | Ht 60.0 in | Wt 142.0 lb

## 2021-08-12 DIAGNOSIS — D1721 Benign lipomatous neoplasm of skin and subcutaneous tissue of right arm: Secondary | ICD-10-CM

## 2021-08-12 DIAGNOSIS — Z0000 Encounter for general adult medical examination without abnormal findings: Secondary | ICD-10-CM

## 2021-08-12 DIAGNOSIS — K069 Disorder of gingiva and edentulous alveolar ridge, unspecified: Secondary | ICD-10-CM

## 2021-08-12 DIAGNOSIS — Z01419 Encounter for gynecological examination (general) (routine) without abnormal findings: Secondary | ICD-10-CM | POA: Diagnosis not present

## 2021-08-12 DIAGNOSIS — R7989 Other specified abnormal findings of blood chemistry: Secondary | ICD-10-CM

## 2021-08-12 DIAGNOSIS — Z1159 Encounter for screening for other viral diseases: Secondary | ICD-10-CM | POA: Diagnosis not present

## 2021-08-12 DIAGNOSIS — Z124 Encounter for screening for malignant neoplasm of cervix: Secondary | ICD-10-CM | POA: Diagnosis not present

## 2021-08-12 DIAGNOSIS — E7800 Pure hypercholesterolemia, unspecified: Secondary | ICD-10-CM

## 2021-08-12 DIAGNOSIS — Z803 Family history of malignant neoplasm of breast: Secondary | ICD-10-CM

## 2021-08-12 DIAGNOSIS — J45909 Unspecified asthma, uncomplicated: Secondary | ICD-10-CM | POA: Diagnosis not present

## 2021-08-12 DIAGNOSIS — B009 Herpesviral infection, unspecified: Secondary | ICD-10-CM

## 2021-08-12 NOTE — Progress Notes (Addendum)
55 y.o. G0P0000 Married White or Caucasian female here for annual exam.  Doing well.  Had back surgery in 2021 and had gall bladder removed 06/2021.  Had [facility] 09/2020 that was 44.    Patient's last menstrual period was 10/20/2016 (approximate).          Sexually active: Yes.    The current method of family planning is post menopausal status.    Exercising: No.   Smoker:  no  Health Maintenance: Pap:  09/22/2016 Negative History of abnormal Pap:  no MMG:  05/24/2021 Negative Colonoscopy:  11/17/2020 BMD:   not indicated Screening Labs: today   reports that she has never smoked. She has never used smokeless tobacco. She reports that she does not currently use alcohol. She reports that she does not use drugs.  Past Medical History:  Diagnosis Date   Asthma    Dyspnea    GERD (gastroesophageal reflux disease)    Hyperlipidemia    Hypothyroidism    Mild vitamin D deficiency     Past Surgical History:  Procedure Laterality Date   CARPAL TUNNEL RELEASE Bilateral 07/2016, 07/2017   CHOLECYSTECTOMY N/A 07/12/2021   Procedure: LAPAROSCOPIC CHOLECYSTECTOMY WITH INTRAOPERATIVE CHOLANGIOGRAM;  Surgeon: Ralene Ok, MD;  Location: [facility];  Service: General;  Laterality: N/A;   COLONOSCOPY  11/17/2020   TMJ ARTHROPLASTY     TRANSFORAMINAL LUMBAR INTERBODY FUSION (TLIF) WITH PEDICLE SCREW FIXATION 1 LEVEL Right 11/07/2019   Procedure: RIGHT-SIDED LUMBAR 4-5 TRANSFORAMINAL LUMBAR INTERBODY FUSION WITH INSTRUMENTATION AND ALLOGRAFT;  Surgeon: Phylliss Bob, MD;  Location: [facility];  Service: Orthopedics;  Laterality: Right;    Current Outpatient Medications  Medication Sig Dispense Refill   acetaminophen (TYLENOL) 500 MG tablet Take 1,000 mg by mouth every 6 (six) hours as needed for mild pain.     budesonide-formoterol (SYMBICORT) 160-4.5 MCG/ACT inhaler Inhale 2 puffs into the lungs 2 (two) times daily.      famotidine (PEPCID) 40 MG tablet Take 40 mg by mouth 2 (two) times daily.      levothyroxine (SYNTHROID) 75 MCG tablet Take 75 mcg by mouth daily before breakfast.      Magnesium 500 MG TABS Take 500 mg by mouth daily with supper.     methocarbamol (ROBAXIN) 500 MG tablet Take 500 mg by mouth daily as needed for muscle spasms.     Potassium 99 MG TABS Take 99 mg by mouth daily.     Probiotic TBEC Take 1 tablet by mouth daily.     valACYclovir (VALTREX) 1000 MG tablet Take 1 tablet (1,000 mg total) by mouth daily as needed. (Patient taking differently: Take 1,000 mg by mouth daily as needed (fever blister).) 30 tablet 1   No current facility-administered medications for this visit.    Family History  Problem Relation Age of Onset   Thyroid disease Mother    Asthma Father        uses inhaler   Cancer Father        prostate & blood   Hypertension Sister    Breast cancer Paternal Grandmother    Colon cancer Maternal Grandfather 16   Esophageal cancer Neg Hx    Rectal cancer Neg Hx    Stomach cancer Neg Hx     Review of Systems  All other systems reviewed and are negative.  Exam:   BP (!) 142/97 (BP Location: Left Arm, Patient Position: Sitting, Cuff Size: Normal) Comment: Patient states she used her inhaler before coming to appointment  Pulse 88    Ht 5' (1.524 m) Comment: reported   Wt 142 lb (64.4 kg)    LMP 10/20/2016 (Approximate) Comment: Menopasual   BMI 27.73 kg/m   Height: 5' (152.4 cm) (reported)  General appearance: alert, cooperative and appears stated age Head: Normocephalic, without obvious abnormality, atraumatic Neck: no adenopathy, supple, symmetrical, trachea midline and thyroid normal to inspection and palpation Lungs: clear to auscultation bilaterally Breasts: normal appearance, no masses or tenderness, right axillary mass that is soft, about 3.5- 4cm Heart: regular rate and rhythm Abdomen: soft, non-tender; bowel sounds normal; no masses,  no organomegaly Extremities: extremities normal, atraumatic, no cyanosis or edema Skin: Skin color,  texture, turgor normal. No rashes or lesions Lymph nodes: Cervical, supraclavicular, and axillary nodes normal. No abnormal inguinal nodes palpated Neurologic: Grossly normal   Pelvic: External genitalia:  no lesions              Urethra:  normal appearing urethra with no masses, tenderness or lesions              Bartholins and Skenes: normal                 Vagina: normal appearing vagina with normal color and no discharge, no lesions              Cervix: no lesions              Pap taken: Yes.   Bimanual Exam:  Uterus:  normal size, contour, position, consistency, mobility, non-tender              Adnexa: normal adnexa and no mass, fullness, tenderness               Rectovaginal: Confirms               Anus:  normal sphincter tone, no lesions  Chaperone, Octaviano Batty, CMA, was present for exam.  Assessment/Plan: 1. Well woman exam with routine gynecological exam - pap and HR HPV obtained today - MMG up to date - colonoscopy 10/2020.  Follow up 7 years.  Adenomatous polyps - BMD not indicated  2. Family history of breast cancer - 11% lifetime risk with Tyrer Cusick model  3. Disease of gingiva due to recurrent oral herpes simplex virus (HSV) infection - does not need valtrex refill  4. Moderate asthma without complication, unspecified whether persistent  5. Elevated serum creatinine - Comprehensive metabolic panel  6. Blood tests for routine general physical examination - Lipid panel - TSH  7. Elevated cholesterol - Lipid panel  8.  Axillary mass, c/w lipoma - referral to plastic surgery

## 2021-08-13 DIAGNOSIS — R0602 Shortness of breath: Secondary | ICD-10-CM | POA: Diagnosis not present

## 2021-08-13 DIAGNOSIS — R058 Other specified cough: Secondary | ICD-10-CM | POA: Diagnosis not present

## 2021-08-13 DIAGNOSIS — J069 Acute upper respiratory infection, unspecified: Secondary | ICD-10-CM | POA: Diagnosis not present

## 2021-08-13 DIAGNOSIS — R0981 Nasal congestion: Secondary | ICD-10-CM | POA: Diagnosis not present

## 2021-08-13 LAB — LIPID PANEL
Chol/HDL Ratio: 3.7 ratio (ref 0.0–4.4)
Cholesterol, Total: 231 mg/dL — ABNORMAL HIGH (ref 100–199)
HDL: 62 mg/dL (ref >39)
LDL Chol Calc (NIH): 149 mg/dL — ABNORMAL HIGH (ref 0–99)
Triglycerides: 112 mg/dL (ref 0–149)
VLDL Cholesterol Cal: 20 mg/dL (ref 5–40)

## 2021-08-13 LAB — COMPREHENSIVE METABOLIC PANEL
ALT: 41 IU/L — ABNORMAL HIGH (ref 0–32)
AST: 35 IU/L (ref 0–40)
Albumin/Globulin Ratio: 1.9 (ref 1.2–2.2)
Albumin: 4.7 g/dL (ref 3.8–4.9)
Alkaline Phosphatase: 108 IU/L (ref 44–121)
BUN/Creatinine Ratio: 13 (ref 9–23)
BUN: 11 mg/dL (ref 6–24)
Bilirubin Total: 0.3 mg/dL (ref 0.0–1.2)
CO2: 20 mmol/L (ref 20–29)
Calcium: 9.7 mg/dL (ref 8.7–10.2)
Chloride: 106 mmol/L (ref 96–106)
Creatinine, Ser: 0.85 mg/dL (ref 0.57–1.00)
Globulin, Total: 2.5 g/dL (ref 1.5–4.5)
Glucose: 91 mg/dL (ref 70–99)
Potassium: 4.2 mmol/L (ref 3.5–5.2)
Sodium: 140 mmol/L (ref 134–144)
Total Protein: 7.2 g/dL (ref 6.0–8.5)
eGFR: 82 mL/min/{1.73_m2} (ref >59)

## 2021-08-13 LAB — TSH: TSH: 2.14 u[IU]/mL (ref 0.450–4.500)

## 2021-08-13 LAB — HEPATITIS C ANTIBODY: Hep C Virus Ab: 0.1 s/co ratio (ref 0.0–0.9)

## 2021-08-13 NOTE — Addendum Note (Signed)
Addended by: Megan Salon on: 08/13/2021 09:14 AM   Modules accepted: Orders

## 2021-08-18 LAB — CYTOLOGY - PAP
Comment: NEGATIVE
Diagnosis: NEGATIVE
High risk HPV: NEGATIVE

## 2021-09-20 ENCOUNTER — Encounter (HOSPITAL_BASED_OUTPATIENT_CLINIC_OR_DEPARTMENT_OTHER): Payer: Self-pay | Admitting: Obstetrics & Gynecology

## 2021-10-11 ENCOUNTER — Other Ambulatory Visit: Payer: Self-pay

## 2021-10-11 ENCOUNTER — Ambulatory Visit: Payer: BC Managed Care – PPO | Admitting: Plastic Surgery

## 2021-10-11 VITALS — BP 134/84 | HR 100 | Ht 60.0 in | Wt 146.8 lb

## 2021-10-11 DIAGNOSIS — D1721 Benign lipomatous neoplasm of skin and subcutaneous tissue of right arm: Secondary | ICD-10-CM | POA: Diagnosis not present

## 2021-10-11 NOTE — Progress Notes (Signed)
Referring Provider Shirline Frees, MD [place],  [place] [address]   CC:  Right underarm fatty mass.   Sara Nichols is an 56 y.o. female.  HPI: The patient is a 56 year old with a 2-year history of right underarm mass.  She notes that it has gotten larger.  She has not had previous biopsy or treatment.  She does have some discomfort associated with this mass.  Patient is otherwise relatively healthy and is a non-smoker.  No Known Allergies  Outpatient Encounter Medications as of 10/11/2021  Medication Sig   acetaminophen (TYLENOL) 500 MG tablet Take 1,000 mg by mouth every 6 (six) hours as needed for mild pain.   budesonide-formoterol (SYMBICORT) 160-4.5 MCG/ACT inhaler Inhale 2 puffs into the lungs 2 (two) times daily.    famotidine (PEPCID) 40 MG tablet Take 40 mg by mouth 2 (two) times daily.   levothyroxine (SYNTHROID) 75 MCG tablet Take 75 mcg by mouth daily before breakfast.    Magnesium 500 MG TABS Take 500 mg by mouth daily with supper.   methocarbamol (ROBAXIN) 500 MG tablet Take 500 mg by mouth daily as needed for muscle spasms.   Potassium 99 MG TABS Take 99 mg by mouth daily.   Probiotic TBEC Take 1 tablet by mouth daily.   valACYclovir (VALTREX) 1000 MG tablet Take 1 tablet (1,000 mg total) by mouth daily as needed. (Patient taking differently: Take 1,000 mg by mouth daily as needed (fever blister).)   No facility-administered encounter medications on file as of 10/11/2021.     Past Medical History:  Diagnosis Date   Asthma    Dyspnea    GERD (gastroesophageal reflux disease)    Hyperlipidemia    Hypothyroidism    Mild vitamin D deficiency     Past Surgical History:  Procedure Laterality Date   CARPAL TUNNEL RELEASE Bilateral 07/2016, 07/2017   CHOLECYSTECTOMY N/A 07/12/2021   Procedure: LAPAROSCOPIC CHOLECYSTECTOMY WITH INTRAOPERATIVE CHOLANGIOGRAM;  Surgeon: Ralene Ok, MD;  Location: [facility];  Service: General;   Laterality: N/A;   COLONOSCOPY  11/17/2020   TMJ ARTHROPLASTY     TRANSFORAMINAL LUMBAR INTERBODY FUSION (TLIF) WITH PEDICLE SCREW FIXATION 1 LEVEL Right 11/07/2019   Procedure: RIGHT-SIDED LUMBAR 4-5 TRANSFORAMINAL LUMBAR INTERBODY FUSION WITH INSTRUMENTATION AND ALLOGRAFT;  Surgeon: Phylliss Bob, MD;  Location: [facility];  Service: Orthopedics;  Laterality: Right;    Family History  Problem Relation Age of Onset   Thyroid disease Mother    Asthma Father        uses inhaler   Cancer Father        prostate & blood   Hypertension Sister    Breast cancer Paternal Grandmother    Colon cancer Maternal Grandfather 38   Esophageal cancer Neg Hx    Rectal cancer Neg Hx    Stomach cancer Neg Hx     Social History   Social History Narrative   Not on file     Review of Systems General: Denies fevers, chills, weight loss CV: Denies chest pain, shortness of breath, palpitations   Physical Exam Vitals with BMI 10/11/2021 08/12/2021 07/12/2021  Height 5\' 0"  5\' 0"  -  Weight 146 lbs 13 oz 142 lbs -  BMI 29.79 89.21 -  Systolic 194 174 081  Diastolic 84 97 92  Pulse 448 88 76    General:  No acute distress,  Alert and oriented, Non-Toxic, Normal speech and affect Extremity: 5 x 4 cm mass under right  axilla.  The mass feels fatty on palpation and relatively superficial.  Assessment/Plan Excision of fatty mass right axilla indicated.  We will plan to send this to pathology.  There is also possibility this could be accessory breast tissue.  [name] 10/11/2021, 1:25 PM

## 2021-10-20 HISTORY — PX: BREAST EXCISIONAL BIOPSY: SUR124

## 2021-11-15 NOTE — Progress Notes (Signed)
? ?  Patient ID: Sara Nichols, female    DOB: 1966-05-22, 56 y.o.   MRN: [id number] ? ?Chief Complaint  ?Patient presents with  ? Pre-op Exam  ? ? ?  ICD-10-CM   ?1. Lipoma of right axilla  D17.21   ?  ? ? ? ?History of Present Illness: ?Sara Nichols is a 56 y.o.  female  with a history of lipoma right axilla.  She presents for preoperative evaluation for upcoming procedure, excision of deep tissue mass, scheduled for 12/08/2021 with Dr.  Erin Hearing . ? ?The patient endorses history of postoperative nausea and vomiting.  No history of cancer, MI, CVA.  She does not take blood thinners.  No personal or family history of blood clots or clotting disorder.  No recent hospitalizations.  She does have a history of asthma, but states that is well controlled on Symbicort and never has to use her rescue inhaler. ? ?Summary of Previous Visit: Patient was seen for consult on 10/11/2021.  At that time, she complained of a 2-year history of mass in right axilla that has been getting progressively larger and more bothersome.  5 x 4 cm mass appreciated on exam.  Suspected to be lipoma versus accessory breast tissue.  Discussed surgical excision.  Patient expressed interest in proceeding with surgery. ? ?Job: Works from home, computer-based position. ? ?PMH Significant for: Axillary deep tissue mass, thyroid disorder, GERD. ? ? ?Past Medical History: ?Allergies: ?No Known Allergies ? ?Current Medications: ? ?Current Outpatient Medications:  ?  acetaminophen (TYLENOL) 500 MG tablet, Take 1,000 mg by mouth every 6 (six) hours as needed for mild pain., Disp: , Rfl:  ?  budesonide-formoterol (SYMBICORT) 160-4.5 MCG/ACT inhaler, Inhale 2 puffs into the lungs 2 (two) times daily. , Disp: , Rfl:  ?  famotidine (PEPCID) 40 MG tablet, Take 40 mg by mouth 2 (two) times daily., Disp: , Rfl:  ?  levothyroxine (SYNTHROID) 75 MCG tablet, Take 75 mcg by mouth daily before breakfast. , Disp: , Rfl:  ?  Magnesium 500 MG TABS, Take 500 mg  by mouth daily with supper., Disp: , Rfl:  ?  methocarbamol (ROBAXIN) 500 MG tablet, Take 500 mg by mouth daily as needed for muscle spasms., Disp: , Rfl:  ?  Potassium 99 MG TABS, Take 99 mg by mouth daily., Disp: , Rfl:  ?  Probiotic TBEC, Take 1 tablet by mouth daily., Disp: , Rfl:  ?  valACYclovir (VALTREX) 1000 MG tablet, Take 1 tablet (1,000 mg total) by mouth daily as needed. (Patient taking differently: Take 1,000 mg by mouth daily as needed (fever blister).), Disp: 30 tablet, Rfl: 1 ? ?Past Medical Problems: ?Past Medical History:  ?Diagnosis Date  ? Asthma   ? Dyspnea   ? GERD (gastroesophageal reflux disease)   ? Hyperlipidemia   ? Hypothyroidism   ? Mild vitamin D deficiency   ? ? ?Past Surgical History: ?Past Surgical History:  ?Procedure Laterality Date  ? CARPAL TUNNEL RELEASE Bilateral 07/2016, 07/2017  ? CHOLECYSTECTOMY N/A 07/12/2021  ? Procedure: LAPAROSCOPIC CHOLECYSTECTOMY WITH INTRAOPERATIVE CHOLANGIOGRAM;  Surgeon: Ralene Ok, MD;  Location: [facility];  Service: General;  Laterality: N/A;  ? COLONOSCOPY  11/17/2020  ? TMJ ARTHROPLASTY    ? TRANSFORAMINAL LUMBAR INTERBODY FUSION (TLIF) WITH PEDICLE SCREW FIXATION 1 LEVEL Right 11/07/2019  ? Procedure: RIGHT-SIDED LUMBAR 4-5 TRANSFORAMINAL LUMBAR INTERBODY FUSION WITH INSTRUMENTATION AND ALLOGRAFT;  Surgeon: Phylliss Bob, MD;  Location: [facility];  Service: Orthopedics;  Laterality: Right;  ? ? ?  Social History: ?Social History  ? ?Socioeconomic History  ? Marital status: Married  ?  Spouse name: Not on file  ? Number of children: 0  ? Years of education: Not on file  ? Highest education level: Not on file  ?Occupational History  ? Occupation: Collections  ?  Employer: [employer]  ?Tobacco Use  ? Smoking status: Never  ? Smokeless tobacco: Never  ?Vaping Use  ? Vaping Use: Never used  ?Substance and Sexual Activity  ? Alcohol use: Not Currently  ? Drug use: No  ? Sexual activity: Yes  ?  Partners: Male  ?  Birth control/protection: Pill   ?Other Topics Concern  ? Not on file  ?Social History Narrative  ? Not on file  ? ?Social Determinants of Health  ? ?Financial Resource Strain: Not on file  ?Food Insecurity: Not on file  ?Transportation Needs: Not on file  ?Physical Activity: Not on file  ?Stress: Not on file  ?Social Connections: Not on file  ?Intimate Partner Violence: Not on file  ? ? ?Family History: ?Family History  ?Problem Relation Age of Onset  ? Thyroid disease Mother   ? Asthma Father   ?     uses inhaler  ? Cancer Father   ?     prostate & blood  ? Hypertension Sister   ? Breast cancer Paternal Grandmother   ? Colon cancer Maternal Grandfather 23  ? Esophageal cancer Neg Hx   ? Rectal cancer Neg Hx   ? Stomach cancer Neg Hx   ? ? ?Review of Systems: ?ROS ?Denies recent fevers, infection, chest pain, or difficulty breathing. ? ?Physical Exam: ?Vital Signs ?BP (!) 149/101 (BP Location: Left Arm, Patient Position: Sitting, Cuff Size: Normal)   Pulse 98   Ht 5' (1.524 m)   Wt 147 lb (66.7 kg)   LMP 10/20/2016 (Approximate) Comment: Menopasual  SpO2 95%   BMI 28.71 kg/m?  ? ?Physical Exam ?Constitutional:   ?   General: Not in acute distress. ?   Appearance: Normal appearance. Not ill-appearing.  ?HENT:  ?   Head: Normocephalic and atraumatic.  ?Eyes:  ?   Pupils: Pupils are equal, round. ?Cardiovascular:  ?   Rate and Rhythm: Normal rate. ?   Pulses: Normal pulses.  ?Pulmonary:  ?   Effort: No respiratory distress or increased work of breathing.  Speaks in full sentences. ?Abdominal:  ?   General: Abdomen is flat. No distension.   ?Musculoskeletal: Normal range of motion. No lower extremity swelling or edema. No varicosities.  ?Skin: ?   General: Skin is warm and dry.  ?   Findings: No erythema or rash.  ?Neurological:  ?   Mental Status: Alert and oriented to person, place, and time.  ?Psychiatric:     ?   Mood and Affect: Mood normal.     ?   Behavior: Behavior normal.  ? ? ?Assessment/Plan: ?The patient is scheduled for deep tissue  mass excision with Dr. Erin Hearing.  Risks, benefits, and alternatives of procedure discussed, questions answered and consent obtained.   ? ?Smoking Status: Non-smoker. ?Last Mammogram: 05/2021; Results: BI-RADS Category 1: Negative. ? ?Caprini Score: 3; Risk Factors include: Age, BMI greater than 25, and length of planned surgery. Recommendation for mechanical pharmacological prophylaxis. Encourage early ambulation.  ? ?Pictures obtained: Today. ? ?Post-op Rx sent to pharmacy: Oxycodone, Zofran. ? ?Patient was provided with the General Surgical Risk consent document and Pain Medication Agreement prior to their appointment.  They had adequate time to read through the risk consent documents and Pain Medication Agreement. We also discussed them in person together during this preop appointment. All of their questions were answered to their satisfaction.  Recommended calling if they have any further questions.  Risk consent form and Pain Medication Agreement to be scanned into patient's chart. ? ?Discussed mitigating risk of wound dehiscence by avoiding any overhead stretching as well as any strenuous lifting/other activities.  She will wear a compressive band around her chest underneath a sports bra to help keep good compression on the excision site. ? ?Electronically signed by: Krista Blue, PA-C 11/17/2021 1:26 PM ?

## 2021-11-17 ENCOUNTER — Encounter: Payer: Self-pay | Admitting: Physician Assistant

## 2021-11-17 ENCOUNTER — Ambulatory Visit (INDEPENDENT_AMBULATORY_CARE_PROVIDER_SITE_OTHER): Payer: BC Managed Care – PPO | Admitting: Physician Assistant

## 2021-11-17 ENCOUNTER — Other Ambulatory Visit: Payer: Self-pay

## 2021-11-17 VITALS — BP 149/101 | HR 98 | Ht 60.0 in | Wt 147.0 lb

## 2021-11-17 DIAGNOSIS — D1721 Benign lipomatous neoplasm of skin and subcutaneous tissue of right arm: Secondary | ICD-10-CM

## 2021-11-29 ENCOUNTER — Encounter (HOSPITAL_BASED_OUTPATIENT_CLINIC_OR_DEPARTMENT_OTHER): Payer: Self-pay | Admitting: Plastic Surgery

## 2021-11-29 ENCOUNTER — Other Ambulatory Visit: Payer: Self-pay

## 2021-12-07 ENCOUNTER — Telehealth: Payer: Self-pay

## 2021-12-07 ENCOUNTER — Telehealth: Payer: Self-pay | Admitting: Plastic Surgery

## 2021-12-07 MED ORDER — OXYCODONE HCL 5 MG PO TABS: 5.0000 mg | ORAL_TABLET | Freq: Four times a day (QID) | ORAL | 0 refills | Status: AC | PRN

## 2021-12-07 MED ORDER — ONDANSETRON 4 MG PO TBDP: 4.0000 mg | ORAL_TABLET | Freq: Three times a day (TID) | ORAL | 0 refills | Status: DC | PRN

## 2021-12-07 NOTE — Telephone Encounter (Signed)
Called pt, na, left vm stating that I have sent both of her messages to Dr. Erin Hearing and Donna Christen. Adv that Donna Christen is still in [place] and has not had a chance to review his messages. I also adv that if a controlled substance is not picked up at the pharmacy within 4 days of when it was sent the pharmacy will put it back. She can call pharmacy to get Zofran filled but a provider will have to resend the order for the oxycodone.  ? ?Will call pt back once I receive a response from either provider.  ?

## 2021-12-07 NOTE — Telephone Encounter (Signed)
Patient left a voicemail message this morning to follow up on tomorrow's appointment. Patient checking on status of medication she was told would be called into her pharmacy to be taken beforehand.  ? ?Please advise at [phone]. ?

## 2021-12-07 NOTE — Telephone Encounter (Signed)
Just got out of surgery, calling it in now. Thanks ?

## 2021-12-07 NOTE — Addendum Note (Signed)
Addended by: Krista Blue on: 12/07/2021 04:54 PM ? ? Modules accepted: Orders ? ?

## 2021-12-07 NOTE — Telephone Encounter (Signed)
Patient called to say she is having surgery tomorrow and needs to have her medication called in to the pharmacy.   ? ?**Patient's preferred pharmacy [facility] [address] in [place], [place]. ?

## 2021-12-07 NOTE — Telephone Encounter (Signed)
Called it in. Just to be clear, she is not intended to take any of these prescribed medications prior to surgery. Thanks

## 2021-12-08 ENCOUNTER — Other Ambulatory Visit: Payer: Self-pay

## 2021-12-08 ENCOUNTER — Encounter (HOSPITAL_BASED_OUTPATIENT_CLINIC_OR_DEPARTMENT_OTHER)
Admission: RE | Disposition: A | Discharge status: Home / Self Care | Payer: Self-pay | Source: Ambulatory Visit | Attending: Plastic Surgery

## 2021-12-08 ENCOUNTER — Ambulatory Visit (HOSPITAL_BASED_OUTPATIENT_CLINIC_OR_DEPARTMENT_OTHER): Payer: BC Managed Care – PPO | Admitting: Certified Registered"

## 2021-12-08 ENCOUNTER — Encounter (HOSPITAL_BASED_OUTPATIENT_CLINIC_OR_DEPARTMENT_OTHER): Payer: Self-pay | Admitting: Plastic Surgery

## 2021-12-08 ENCOUNTER — Ambulatory Visit (HOSPITAL_BASED_OUTPATIENT_CLINIC_OR_DEPARTMENT_OTHER)
Admission: RE | Admit: 2021-12-08 | Discharge: 2021-12-08 | Disposition: A | Discharge status: Home / Self Care | Payer: BC Managed Care – PPO | Source: Ambulatory Visit | Attending: Plastic Surgery | Admitting: Plastic Surgery

## 2021-12-08 DIAGNOSIS — Z79899 Other long term (current) drug therapy: Secondary | ICD-10-CM | POA: Insufficient documentation

## 2021-12-08 DIAGNOSIS — J45909 Unspecified asthma, uncomplicated: Secondary | ICD-10-CM | POA: Diagnosis not present

## 2021-12-08 DIAGNOSIS — D1739 Benign lipomatous neoplasm of skin and subcutaneous tissue of other sites: Secondary | ICD-10-CM | POA: Diagnosis not present

## 2021-12-08 DIAGNOSIS — D1721 Benign lipomatous neoplasm of skin and subcutaneous tissue of right arm: Secondary | ICD-10-CM | POA: Diagnosis not present

## 2021-12-08 DIAGNOSIS — Z79890 Hormone replacement therapy (postmenopausal): Secondary | ICD-10-CM | POA: Insufficient documentation

## 2021-12-08 DIAGNOSIS — E039 Hypothyroidism, unspecified: Secondary | ICD-10-CM | POA: Diagnosis not present

## 2021-12-08 DIAGNOSIS — Z7951 Long term (current) use of inhaled steroids: Secondary | ICD-10-CM | POA: Diagnosis not present

## 2021-12-08 DIAGNOSIS — K219 Gastro-esophageal reflux disease without esophagitis: Secondary | ICD-10-CM | POA: Diagnosis not present

## 2021-12-08 DIAGNOSIS — R222 Localized swelling, mass and lump, trunk: Secondary | ICD-10-CM | POA: Diagnosis not present

## 2021-12-08 HISTORY — PX: MASS EXCISION: SHX2000

## 2021-12-08 HISTORY — DX: Other specified postprocedural states: Z98.890

## 2021-12-08 SURGERY — EXCISION MASS
Anesthesia: General | Site: Axilla | Laterality: Right

## 2021-12-08 MED ORDER — 0.9 % SODIUM CHLORIDE (POUR BTL) OPTIME
TOPICAL | Status: DC | PRN
  Administered 2021-12-08: 1000 mL

## 2021-12-08 MED ORDER — DEXAMETHASONE SODIUM PHOSPHATE 10 MG/ML IJ SOLN
INTRAMUSCULAR | Status: AC
  Filled 2021-12-08: qty 1

## 2021-12-08 MED ORDER — DROPERIDOL 2.5 MG/ML IJ SOLN
INTRAMUSCULAR | Status: DC | PRN
  Administered 2021-12-08: .625 mg via INTRAVENOUS

## 2021-12-08 MED ORDER — DROPERIDOL 2.5 MG/ML IJ SOLN
INTRAMUSCULAR | Status: AC
  Filled 2021-12-08: qty 2

## 2021-12-08 MED ORDER — ONDANSETRON HCL 4 MG/2ML IJ SOLN
INTRAMUSCULAR | Status: AC
  Filled 2021-12-08: qty 2

## 2021-12-08 MED ORDER — BUPIVACAINE-EPINEPHRINE 0.25% -1:200000 IJ SOLN
INTRAMUSCULAR | Status: DC | PRN
  Administered 2021-12-08: 10 mL

## 2021-12-08 MED ORDER — MIDAZOLAM HCL 5 MG/5ML IJ SOLN
INTRAMUSCULAR | Status: DC | PRN
  Administered 2021-12-08: 2 mg via INTRAVENOUS

## 2021-12-08 MED ORDER — LIDOCAINE 2% (20 MG/ML) 5 ML SYRINGE
INTRAMUSCULAR | Status: DC | PRN
Start: 2021-12-08 — End: 2021-12-08
  Administered 2021-12-08: 60 mg via INTRAVENOUS

## 2021-12-08 MED ORDER — ONDANSETRON HCL 4 MG/2ML IJ SOLN
INTRAMUSCULAR | Status: DC | PRN
Start: 2021-12-08 — End: 2021-12-08
  Administered 2021-12-08: 4 mg via INTRAVENOUS

## 2021-12-08 MED ORDER — LACTATED RINGERS IV SOLN: INTRAVENOUS | Status: DC

## 2021-12-08 MED ORDER — PROMETHAZINE HCL 25 MG/ML IJ SOLN: 6.2500 mg | INTRAMUSCULAR | Status: DC | PRN

## 2021-12-08 MED ORDER — MIDAZOLAM HCL 2 MG/2ML IJ SOLN
INTRAMUSCULAR | Status: AC
  Filled 2021-12-08: qty 2

## 2021-12-08 MED ORDER — MEPERIDINE HCL 25 MG/ML IJ SOLN: 6.2500 mg | INTRAMUSCULAR | Status: DC | PRN

## 2021-12-08 MED ORDER — OXYCODONE HCL 5 MG/5ML PO SOLN: 5.0000 mg | Freq: Once | ORAL | Status: AC | PRN

## 2021-12-08 MED ORDER — FENTANYL CITRATE (PF) 100 MCG/2ML IJ SOLN
INTRAMUSCULAR | Status: DC | PRN
  Administered 2021-12-08 (×2): 50 ug via INTRAVENOUS

## 2021-12-08 MED ORDER — FENTANYL CITRATE (PF) 100 MCG/2ML IJ SOLN
INTRAMUSCULAR | Status: AC
  Filled 2021-12-08: qty 2

## 2021-12-08 MED ORDER — CEFAZOLIN SODIUM 1 G IJ SOLR
INTRAMUSCULAR | Status: AC
  Filled 2021-12-08: qty 20

## 2021-12-08 MED ORDER — PROPOFOL 10 MG/ML IV BOLUS
INTRAVENOUS | Status: DC | PRN
  Administered 2021-12-08: 200 mg via INTRAVENOUS

## 2021-12-08 MED ORDER — DEXAMETHASONE SODIUM PHOSPHATE 4 MG/ML IJ SOLN
INTRAMUSCULAR | Status: DC | PRN
  Administered 2021-12-08: 8 mg via INTRAVENOUS

## 2021-12-08 MED ORDER — OXYCODONE HCL 5 MG PO TABS
5.0000 mg | ORAL_TABLET | Freq: Once | ORAL | Status: AC | PRN
  Administered 2021-12-08: 5 mg via ORAL

## 2021-12-08 MED ORDER — CEFAZOLIN SODIUM-DEXTROSE 2-3 GM-%(50ML) IV SOLR
INTRAVENOUS | Status: DC | PRN
Start: 2021-12-08 — End: 2021-12-08
  Administered 2021-12-08: 2 g via INTRAVENOUS

## 2021-12-08 MED ORDER — PROPOFOL 10 MG/ML IV BOLUS
INTRAVENOUS | Status: AC
Start: 2021-12-08 — End: ?
  Filled 2021-12-08: qty 20

## 2021-12-08 MED ORDER — HYDROMORPHONE HCL 1 MG/ML IJ SOLN
INTRAMUSCULAR | Status: AC
  Filled 2021-12-08: qty 0.5

## 2021-12-08 MED ORDER — SODIUM CHLORIDE 0.9% FLUSH: 3.0000 mL | Freq: Two times a day (BID) | INTRAVENOUS | Status: DC

## 2021-12-08 MED ORDER — OXYCODONE HCL 5 MG PO TABS
ORAL_TABLET | ORAL | Status: AC
  Filled 2021-12-08: qty 1

## 2021-12-08 MED ORDER — HYDROMORPHONE HCL 1 MG/ML IJ SOLN
0.2500 mg | INTRAMUSCULAR | Status: DC | PRN
  Administered 2021-12-08 (×2): 0.5 mg via INTRAVENOUS

## 2021-12-08 SURGICAL SUPPLY — 83 items
ADH SKN CLS APL DERMABOND .7 (GAUZE/BANDAGES/DRESSINGS)
APL PRP STRL LF DISP 70% ISPRP (MISCELLANEOUS) ×1
APL SKNCLS STERI-STRIP NONHPOA (GAUZE/BANDAGES/DRESSINGS) ×1
BAND INSRT 18 STRL LF DISP RB (MISCELLANEOUS)
BAND RUBBER #18 3X1/16 STRL (MISCELLANEOUS) IMPLANT
BENZOIN TINCTURE PRP APPL 2/3 (GAUZE/BANDAGES/DRESSINGS) ×1 IMPLANT
BLADE CLIPPER SURG (BLADE) IMPLANT
BLADE SURG 15 STRL LF DISP TIS (BLADE) ×1 IMPLANT
BLADE SURG 15 STRL SS (BLADE) ×4
CANISTER SUCT 1200ML W/VALVE (MISCELLANEOUS) ×1 IMPLANT
CHLORAPREP W/TINT 26 (MISCELLANEOUS) ×2 IMPLANT
COVER BACK TABLE 60X90IN (DRAPES) ×2 IMPLANT
COVER MAYO STAND STRL (DRAPES) ×2 IMPLANT
DERMABOND ADVANCED (GAUZE/BANDAGES/DRESSINGS)
DERMABOND ADVANCED .7 DNX12 (GAUZE/BANDAGES/DRESSINGS) IMPLANT
DRAIN JP 10F RND SILICONE (MISCELLANEOUS) IMPLANT
DRAPE LAPAROTOMY 100X72 PEDS (DRAPES) ×1 IMPLANT
DRAPE U-SHAPE 76X120 STRL (DRAPES) IMPLANT
DRAPE UTILITY XL STRL (DRAPES) ×2 IMPLANT
DRESSING MEPILEX FLEX 4X4 (GAUZE/BANDAGES/DRESSINGS) IMPLANT
DRSG MEPILEX FLEX 4X4 (GAUZE/BANDAGES/DRESSINGS) ×2
DRSG TELFA 3X8 NADH (GAUZE/BANDAGES/DRESSINGS) IMPLANT
ELECT COATED BLADE 2.86 ST (ELECTRODE) IMPLANT
ELECT NDL BLADE 2-5/6 (NEEDLE) ×1 IMPLANT
ELECT NEEDLE BLADE 2-5/6 (NEEDLE) IMPLANT
ELECT REM PT RETURN 9FT ADLT (ELECTROSURGICAL) ×2
ELECT REM PT RETURN 9FT PED (ELECTROSURGICAL)
ELECTRODE REM PT RETRN 9FT PED (ELECTROSURGICAL) IMPLANT
ELECTRODE REM PT RTRN 9FT ADLT (ELECTROSURGICAL) IMPLANT
EVACUATOR SILICONE 100CC (DRAIN) IMPLANT
GAUZE SPONGE 4X4 12PLY STRL LF (GAUZE/BANDAGES/DRESSINGS) IMPLANT
GAUZE XEROFORM 1X8 LF (GAUZE/BANDAGES/DRESSINGS) IMPLANT
GLOVE BIOGEL M STRL SZ7.5 (GLOVE) ×2 IMPLANT
GLOVE BIOGEL PI IND STRL 8 (GLOVE) ×1 IMPLANT
GLOVE BIOGEL PI INDICATOR 8 (GLOVE) ×1
GOWN STRL REUS W/ TWL LRG LVL3 (GOWN DISPOSABLE) ×1 IMPLANT
GOWN STRL REUS W/ TWL XL LVL3 (GOWN DISPOSABLE) ×1 IMPLANT
GOWN STRL REUS W/TWL LRG LVL3 (GOWN DISPOSABLE) ×2
GOWN STRL REUS W/TWL XL LVL3 (GOWN DISPOSABLE) ×4
MARKER SKIN DUAL TIP RULER LAB (MISCELLANEOUS) ×1 IMPLANT
NDL FILTER BLUNT 18X1 1/2 (NEEDLE) IMPLANT
NDL HYPO 27GX1-1/4 (NEEDLE) ×1 IMPLANT
NDL HYPO 30GX1 BEV (NEEDLE) IMPLANT
NDL SAFETY ECLIPSE 18X1.5 (NEEDLE) IMPLANT
NEEDLE FILTER BLUNT 18X 1/2SAF (NEEDLE)
NEEDLE FILTER BLUNT 18X1 1/2 (NEEDLE) IMPLANT
NEEDLE HYPO 18GX1.5 SHARP (NEEDLE)
NEEDLE HYPO 27GX1-1/4 (NEEDLE) ×2 IMPLANT
NEEDLE HYPO 30GX1 BEV (NEEDLE) IMPLANT
NS IRRIG 1000ML POUR BTL (IV SOLUTION) ×1 IMPLANT
PACK BASIN DAY SURGERY FS (CUSTOM PROCEDURE TRAY) ×2 IMPLANT
PAD DRESSING TELFA 3X8 NADH (GAUZE/BANDAGES/DRESSINGS) IMPLANT
PENCIL SMOKE EVACUATOR (MISCELLANEOUS) ×2 IMPLANT
SHEET MEDIUM DRAPE 40X70 STRL (DRAPES) IMPLANT
SLEEVE SCD COMPRESS KNEE MED (STOCKING) ×1 IMPLANT
SPONGE GAUZE 2X2 8PLY STRL LF (GAUZE/BANDAGES/DRESSINGS) IMPLANT
SPONGE T-LAP 18X18 ~~LOC~~+RFID (SPONGE) ×1 IMPLANT
STAPLER INSORB 30 2030 C-SECTI (MISCELLANEOUS) IMPLANT
STAPLER VISISTAT 35W (STAPLE) ×1 IMPLANT
STRIP CLOSURE SKIN 1/2X4 (GAUZE/BANDAGES/DRESSINGS) IMPLANT
STRIP SUTURE WOUND CLOSURE 1/2 (MISCELLANEOUS) ×1 IMPLANT
SUCTION FRAZIER HANDLE 10FR (MISCELLANEOUS)
SUCTION TUBE FRAZIER 10FR DISP (MISCELLANEOUS) IMPLANT
SUT ETHILON 4 0 PS 2 18 (SUTURE) IMPLANT
SUT MNCRL AB 4-0 PS2 18 (SUTURE) IMPLANT
SUT MON AB 5-0 P3 18 (SUTURE) IMPLANT
SUT PDS 3-0 CT2 (SUTURE) ×2
SUT PDS II 3-0 CT2 27 ABS (SUTURE) IMPLANT
SUT PLAIN 5 0 P 3 18 (SUTURE) IMPLANT
SUT PROLENE 5 0 P 3 (SUTURE) IMPLANT
SUT VICRYL 4-0 PS2 18IN ABS (SUTURE) IMPLANT
SUT VLOC 90 3-0 CLR P12 (SUTURE) ×1 IMPLANT
SUT VLOC 90 P-14 23 (SUTURE) IMPLANT
SWAB COLLECTION DEVICE MRSA (MISCELLANEOUS) IMPLANT
SWAB CULTURE ESWAB REG 1ML (MISCELLANEOUS) IMPLANT
SYR 50ML LL SCALE MARK (SYRINGE) IMPLANT
SYR BULB EAR ULCER 3OZ GRN STR (SYRINGE) ×1 IMPLANT
SYR CONTROL 10ML LL (SYRINGE) ×2 IMPLANT
TOWEL GREEN STERILE FF (TOWEL DISPOSABLE) ×3 IMPLANT
TRAY DSU PREP LF (CUSTOM PROCEDURE TRAY) IMPLANT
TUBE CONNECTING 20X1/4 (TUBING) ×1 IMPLANT
TUBING INFILTRATION IT-10001 (TUBING) IMPLANT
YANKAUER SUCT BULB TIP NO VENT (SUCTIONS) ×1 IMPLANT

## 2021-12-08 NOTE — Transfer of Care (Signed)
Immediate Anesthesia Transfer of Care Note ? ?Patient: Sara Nichols ? ?Procedure(s) Performed: EXCISION DEEP TISSUE MASS (Right: Axilla) ? ?Patient Location: PACU ? ?Anesthesia Type:General ? ?Level of Consciousness: drowsy ? ?Airway & Oxygen Therapy: Patient Spontanous Breathing and Patient connected to face mask oxygen ? ?Post-op Assessment: Report given to RN and Post -op Vital signs reviewed and stable ? ?Post vital signs: Reviewed and stable ? ?Last Vitals:  ?Vitals Value Taken Time  ?BP 119/77 12/08/21 1337  ?Temp    ?Pulse 86 12/08/21 1339  ?Resp 12 12/08/21 1339  ?SpO2 98 % 12/08/21 1339  ?Vitals shown include unvalidated device data. ? ?Last Pain:  ?Vitals:  ? 12/08/21 1051  ?TempSrc: Oral  ?PainSc: 0-No pain  ?   ? ?  ? ?Complications: No notable events documented. ?

## 2021-12-08 NOTE — Anesthesia Preprocedure Evaluation (Signed)
Anesthesia Evaluation  ?Patient identified by MRN, date of birth, ID band ?Patient awake ? ? ? ?Reviewed: ?Allergy & Precautions, H&P , NPO status , Patient's Chart, lab work & pertinent test results ? ?History of Anesthesia Complications ?(+) PONV and history of anesthetic complications ? ?Airway ?Mallampati: II ? ?TM Distance: >3 FB ?Neck ROM: Full ? ? ? Dental ? ?(+) Dental Advisory Given ?  ?Pulmonary ?asthma ,  ?  ?breath sounds clear to auscultation ? ? ? ? ? ? Cardiovascular ?negative cardio ROS ? ? ?Rhythm:Regular Rate:Normal ? ? ?  ?Neuro/Psych ? Neuromuscular disease   ? GI/Hepatic ?GERD  ,Elevated LFT's ?  ?Endo/Other  ?Hypothyroidism  ? Renal/GU ?Renal InsufficiencyRenal disease  ? ?  ?Musculoskeletal ? ? Abdominal ?  ?Peds ? Hematology ?negative hematology ROS ?(+)   ?Anesthesia Other Findings ? ? Reproductive/Obstetrics ? ?  ? ? ? ? ? ? ? ? ? ? ? ? ? ?  ?  ? ? ? ? ? ? ? ? ?Lab Results  ?Component Value Date  ? WBC 6.0 07/12/2021  ? HGB 14.9 07/12/2021  ? HCT 45.2 07/12/2021  ? MCV 89.0 07/12/2021  ? PLT 341 07/12/2021  ? ?Lab Results  ?Component Value Date  ? CREATININE 0.85 08/12/2021  ? BUN 11 08/12/2021  ? NA 140 08/12/2021  ? K 4.2 08/12/2021  ? CL 106 08/12/2021  ? CO2 20 08/12/2021  ? ? ?Anesthesia Physical ? ?Anesthesia Plan ? ?ASA: 2 ? ?Anesthesia Plan: General  ? ?Post-op Pain Management: Minimal or no pain anticipated  ? ?Induction: Intravenous ? ?PONV Risk Score and Plan: 4 or greater and Dexamethasone, Ondansetron, Midazolam, Treatment may vary due to age or medical condition and Droperidol ? ?Airway Management Planned: LMA ? ?Additional Equipment:  ? ?Intra-op Plan:  ? ?Post-operative Plan: Extubation in OR ? ?Informed Consent: I have reviewed the patients History and Physical, chart, labs and discussed the procedure including the risks, benefits and alternatives for the proposed anesthesia with the patient or authorized representative who has indicated  his/her understanding and acceptance.  ? ? ? ?Dental advisory given ? ?Plan Discussed with: CRNA ? ?Anesthesia Plan Comments:   ? ? ? ? ? ? ?Anesthesia Quick Evaluation ? ?

## 2021-12-08 NOTE — Anesthesia Postprocedure Evaluation (Signed)
Anesthesia Post Note ? ?Patient: Sara Nichols ? ?Procedure(s) Performed: EXCISION DEEP TISSUE MASS (Right: Axilla) ? ?  ? ?Patient location during evaluation: PACU ?Anesthesia Type: General ?Level of consciousness: awake and alert ?Pain management: pain level controlled ?Vital Signs Assessment: post-procedure vital signs reviewed and stable ?Respiratory status: spontaneous breathing, nonlabored ventilation and respiratory function stable ?Cardiovascular status: blood pressure returned to baseline and stable ?Postop Assessment: no apparent nausea or vomiting ?Anesthetic complications: no ? ? ?No notable events documented. ? ?Last Vitals:  ?Vitals:  ? 12/08/21 1430 12/08/21 1514  ?BP: 122/82 140/84  ?Pulse: 65 90  ?Resp: 11 16  ?Temp:  36.6 ?C  ?SpO2: 95% 96%  ?  ?Last Pain:  ?Vitals:  ? 12/08/21 1449  ?TempSrc:   ?PainSc: 4   ? ? ?  ?  ?  ?  ?  ?  ? ?Lynda Rainwater ? ? ? ? ?

## 2021-12-08 NOTE — Telephone Encounter (Signed)
Pt is aware that post op medications were called in yesterday and she can pick them up when she gets out of surgery today. Pt conveyed understanding.  ?

## 2021-12-08 NOTE — Discharge Instructions (Addendum)
?  Post Anesthesia Home Care Instructions ? ?Activity: ?Get plenty of rest for the remainder of the day. A responsible individual must stay with you for 24 hours following the procedure.  ?For the next 24 hours, DO NOT: ?-Drive a car ?-Paediatric nurse ?-Drink alcoholic beverages ?-Take any medication unless instructed by your physician ?-Make any legal decisions or sign important papers. ? ?Meals: ?Start with liquid foods such as gelatin or soup. Progress to regular foods as tolerated. Avoid greasy, spicy, heavy foods. If nausea and/or vomiting occur, drink only clear liquids until the nausea and/or vomiting subsides. Call your physician if vomiting continues. ? ?Special Instructions/Symptoms: ?Your throat may feel dry or sore from the anesthesia or the breathing tube placed in your throat during surgery. If this causes discomfort, gargle with warm salt water. The discomfort should disappear within 24 hours. ? ?If you had a scopolamine patch placed behind your ear for the management of post- operative nausea and/or vomiting: ? ?1. The medication in the patch is effective for 72 hours, after which it should be removed.  Wrap patch in a tissue and discard in the trash. Wash hands thoroughly with soap and water. ?2. You may remove the patch earlier than 72 hours if you experience unpleasant side effects which may include dry mouth, dizziness or visual disturbances. ?3. Avoid touching the patch. Wash your hands with soap and water after contact with the patch. ?    ? ?You had 5 mg of Oxycodone at 2:49 PM. ? ?

## 2021-12-08 NOTE — Anesthesia Procedure Notes (Signed)
Procedure Name: LMA Insertion ?Date/Time: 12/08/2021 12:41 PM ?Performed by: Ezequiel Kayser, CRNA ?Pre-anesthesia Checklist: Patient identified, Emergency Drugs available, Suction available and Patient being monitored ?Patient Re-evaluated:Patient Re-evaluated prior to induction ?Oxygen Delivery Method: Circle System Utilized ?Preoxygenation: Pre-oxygenation with 100% oxygen ?Induction Type: IV induction ?Ventilation: Mask ventilation without difficulty ?LMA: LMA inserted ?LMA Size: 4.0 ?Number of attempts: 1 ?Airway Equipment and Method: Bite block ?Placement Confirmation: positive ETCO2 ?Tube secured with: Tape ?Dental Injury: Teeth and Oropharynx as per pre-operative assessment  ? ? ? ? ?

## 2021-12-08 NOTE — Op Note (Signed)
Operative Note  ? ?DATE OF OPERATION: 12/08/2021 ? ?SURGICAL DEPARTMENT: Plastic Surgery ? ?PREOPERATIVE DIAGNOSES:  right axillary mass ? ?POSTOPERATIVE DIAGNOSES:  same ? ?PROCEDURE:  ?1)  excision of right axillary deep tissue mass 6 cm ?2) 6 cm complex closure right axilla with significant skin excision. ? ?SURGEON: Melene Plan. Talley Casco, MD ? ?ASSISTANT: None. ? ?ANESTHESIA:  General.  ? ?COMPLICATIONS: None.  ? ?INDICATIONS FOR PROCEDURE:  ?The patient, Sara Nichols is a 56 y.o. female born on 11/13/1965, is here for treatment of right axillary mass. ?MRN: [id number] ? ?CONSENT:  ?Informed consent was obtained directly from the patient. Risks, benefits and alternatives were fully discussed. Specific risks including but not limited to bleeding, infection, hematoma, seroma, scarring, pain, contracture, asymmetry, wound healing problems, and need for further surgery were all discussed. The patient did have an ample opportunity to have questions answered to satisfaction.  ? ?DESCRIPTION OF PROCEDURE:  ?The patient was taken to the operating room. SCDs were placed and preoperative antibiotics were given. General anesthesia was administered.  The patient's operative site was prepped and draped in a sterile fashion. A time out was performed and all information was confirmed to be correct.   ? ?Local anesthetic, 0.25% marcaine with epinephrine was injected into the right axilla.  An incision was made in the right axilla.  A combination of sharp and blunt dissection with tenotomy scissors was performed.  The right axillary mass was removed.  The incision was then closed in a layered fashioned with 3-0 PDS followed by a running V-lock 90 suture.  Steri-strips and a dry sterile dressing were applied.  The specimen was sent to pathology.  An additional ellipse of skin was excised to improve contour. ? ?The patient tolerated the procedure well.  There were no complications. The patient was allowed to wake from anesthesia,  extubated and taken to the recovery room in satisfactory condition.   ?

## 2021-12-09 ENCOUNTER — Encounter (HOSPITAL_BASED_OUTPATIENT_CLINIC_OR_DEPARTMENT_OTHER): Payer: Self-pay | Admitting: Plastic Surgery

## 2021-12-09 ENCOUNTER — Telehealth: Payer: Self-pay

## 2021-12-09 LAB — SURGICAL PATHOLOGY

## 2021-12-09 NOTE — Telephone Encounter (Signed)
Relayed Sara Nichols's information to patient along with f/up appt date. Pt conveyed understanding.  ?

## 2021-12-09 NOTE — Telephone Encounter (Signed)
Patient called to say she had a surgery yesterday and would like to have instructions regarding her bandage and post-care instructions. ?

## 2021-12-10 NOTE — H&P (Signed)
Chief Complaint  ?Patient presents with  ? Pre-op Exam  ?  ?  ?    ICD-10-CM    ?1. Lipoma of right axilla  D17.21    ?   ?  ?  ?  ?History of Present Illness: ?Sara Nichols is a 56 y.o.  female  with a history of lipoma right axilla.  She presents for preoperative evaluation for upcoming procedure, excision of deep tissue mass, scheduled for today.  ?  ?The patient endorses history of postoperative nausea and vomiting.  No history of cancer, MI, CVA.  She does not take blood thinners.  No personal or family history of blood clots or clotting disorder.  No recent hospitalizations.  She does have a history of asthma, but states that is well controlled on Symbicort and never has to use her rescue inhaler. ?  ?Summary of Previous Visit: Patient was seen for consult on 10/11/2021.  At that time, she complained of a 2-year history of mass in right axilla that has been getting progressively larger and more bothersome.  5 x 4 cm mass appreciated on exam.  Suspected to be lipoma versus accessory breast tissue.  Discussed surgical excision.  Patient expressed interest in proceeding with surgery. ?  ?Job: Works from home, computer-based position. ?  ?PMH Significant for: Axillary deep tissue mass, thyroid disorder, GERD. ?  ?  ?Past Medical History: ?Allergies: ?No Known Allergies ?  ?Current Medications: ?  ?Current Outpatient Medications:  ?  acetaminophen (TYLENOL) 500 MG tablet, Take 1,000 mg by mouth every 6 (six) hours as needed for mild pain., Disp: , Rfl:  ?  budesonide-formoterol (SYMBICORT) 160-4.5 MCG/ACT inhaler, Inhale 2 puffs into the lungs 2 (two) times daily. , Disp: , Rfl:  ?  famotidine (PEPCID) 40 MG tablet, Take 40 mg by mouth 2 (two) times daily., Disp: , Rfl:  ?  levothyroxine (SYNTHROID) 75 MCG tablet, Take 75 mcg by mouth daily before breakfast. , Disp: , Rfl:  ?  Magnesium 500 MG TABS, Take 500 mg by mouth daily with supper., Disp: , Rfl:  ?  methocarbamol (ROBAXIN) 500 MG tablet, Take 500 mg by  mouth daily as needed for muscle spasms., Disp: , Rfl:  ?  Potassium 99 MG TABS, Take 99 mg by mouth daily., Disp: , Rfl:  ?  Probiotic TBEC, Take 1 tablet by mouth daily., Disp: , Rfl:  ?  valACYclovir (VALTREX) 1000 MG tablet, Take 1 tablet (1,000 mg total) by mouth daily as needed. (Patient taking differently: Take 1,000 mg by mouth daily as needed (fever blister).), Disp: 30 tablet, Rfl: 1 ?  ?Past Medical Problems: ?    ?Past Medical History:  ?Diagnosis Date  ? Asthma    ? Dyspnea    ? GERD (gastroesophageal reflux disease)    ? Hyperlipidemia    ? Hypothyroidism    ? Mild vitamin D deficiency    ?  ?  ?Past Surgical History: ?     ?Past Surgical History:  ?Procedure Laterality Date  ? CARPAL TUNNEL RELEASE Bilateral 07/2016, 07/2017  ? CHOLECYSTECTOMY N/A 07/12/2021  ?  Procedure: LAPAROSCOPIC CHOLECYSTECTOMY WITH INTRAOPERATIVE CHOLANGIOGRAM;  Surgeon: Ralene Ok, MD;  Location: [facility];  Service: General;  Laterality: N/A;  ? COLONOSCOPY   11/17/2020  ? TMJ ARTHROPLASTY      ? TRANSFORAMINAL LUMBAR INTERBODY FUSION (TLIF) WITH PEDICLE SCREW FIXATION 1 LEVEL Right 11/07/2019  ?  Procedure: RIGHT-SIDED LUMBAR 4-5 TRANSFORAMINAL LUMBAR INTERBODY FUSION WITH INSTRUMENTATION AND ALLOGRAFT;  Surgeon: Phylliss Bob, MD;  Location: [facility];  Service: Orthopedics;  Laterality: Right;  ?  ?  ?Social History: ?Social History  ?  ?     ?Socioeconomic History  ? Marital status: Married  ?    Spouse name: Not on file  ? Number of children: 0  ? Years of education: Not on file  ? Highest education level: Not on file  ?Occupational History  ? Occupation: Collections  ?    Employer: [employer]  ?Tobacco Use  ? Smoking status: Never  ? Smokeless tobacco: Never  ?Vaping Use  ? Vaping Use: Never used  ?Substance and Sexual Activity  ? Alcohol use: Not Currently  ? Drug use: No  ? Sexual activity: Yes  ?    Partners: Male  ?    Birth control/protection: Pill  ?Other Topics Concern  ? Not on file  ?Social History  Narrative  ? Not on file  ?  ?Social Determinants of Health  ?  ?Financial Resource Strain: Not on file  ?Food Insecurity: Not on file  ?Transportation Needs: Not on file  ?Physical Activity: Not on file  ?Stress: Not on file  ?Social Connections: Not on file  ?Intimate Partner Violence: Not on file  ?  ?  ?Family History: ?     ?Family History  ?Problem Relation Age of Onset  ? Thyroid disease Mother    ? Asthma Father    ?      uses inhaler  ? Cancer Father    ?      prostate & blood  ? Hypertension Sister    ? Breast cancer Paternal Grandmother    ? Colon cancer Maternal Grandfather 60  ? Esophageal cancer Neg Hx    ? Rectal cancer Neg Hx    ? Stomach cancer Neg Hx    ?  ?  ?Review of Systems: ?ROS ?Denies recent fevers, infection, chest pain, or difficulty breathing. ?  ?Physical Exam: ?Vital Signs ?VSS ?Physical Exam ?Constitutional:   ?   General: Not in acute distress. ?   Appearance: Normal appearance. Not ill-appearing.  ?HENT:  ?   Head: Normocephalic and atraumatic.  ?Eyes:  ?   Pupils: Pupils are equal, round. ?Cardiovascular:  ?   Rate and Rhythm: Normal rate. ?   Pulses: Normal pulses.  ?Pulmonary:  ?   Effort: No respiratory distress or increased work of breathing.  Speaks in full sentences. ?Abdominal:  ?   General: Abdomen is flat. No distension.   ?Musculoskeletal: Normal range of motion. No lower extremity swelling or edema. No varicosities.  ?Skin: ?   General: Skin is warm and dry.  ?   Findings: No erythema or rash.  ?Neurological:  ?   Mental Status: Alert and oriented to person, place, and time.  ?Psychiatric:     ?   Mood and Affect: Mood normal.     ?   Behavior: Behavior normal.  ?  ?  ?Assessment/Plan: ?Excision of right axillary mass planned. ?

## 2021-12-17 ENCOUNTER — Encounter: Payer: Self-pay | Admitting: Plastic Surgery

## 2021-12-17 ENCOUNTER — Ambulatory Visit (INDEPENDENT_AMBULATORY_CARE_PROVIDER_SITE_OTHER): Payer: BC Managed Care – PPO | Admitting: Plastic Surgery

## 2021-12-17 DIAGNOSIS — D1721 Benign lipomatous neoplasm of skin and subcutaneous tissue of right arm: Secondary | ICD-10-CM

## 2021-12-17 NOTE — Progress Notes (Signed)
Status post excision of right axillary mass.  Patient is doing well without complaints. ? ?Physical exam ?Incision clean dry intact, improved contour ? ?Physical pathology: Benign ? ?Assessment and plan ?Discussed with the patient the pathology is benign and consistent with lipoma or it may be possible that she has accessory breast tissue since I did note that she has some small area of fullness in the left axilla also.  We discussed that this could also be removed at a future date. ?

## 2021-12-23 IMAGING — RF DG C-ARM 1-60 MIN
1 series · 2 of 2 positions shown · IV contrast (agent unspecified)
Comparison: 11/07/2019.

CLINICAL DATA: Lumbar fusion.

EXAM:
DG C-ARM 1-60 MIN
CONTRAST:  No prior.
FLUOROSCOPY TIME:  Fluoroscopy Time:  1 minutes 32 seconds.

[Series 1: run · 2 of 2 slices shown]
[im 1/2]
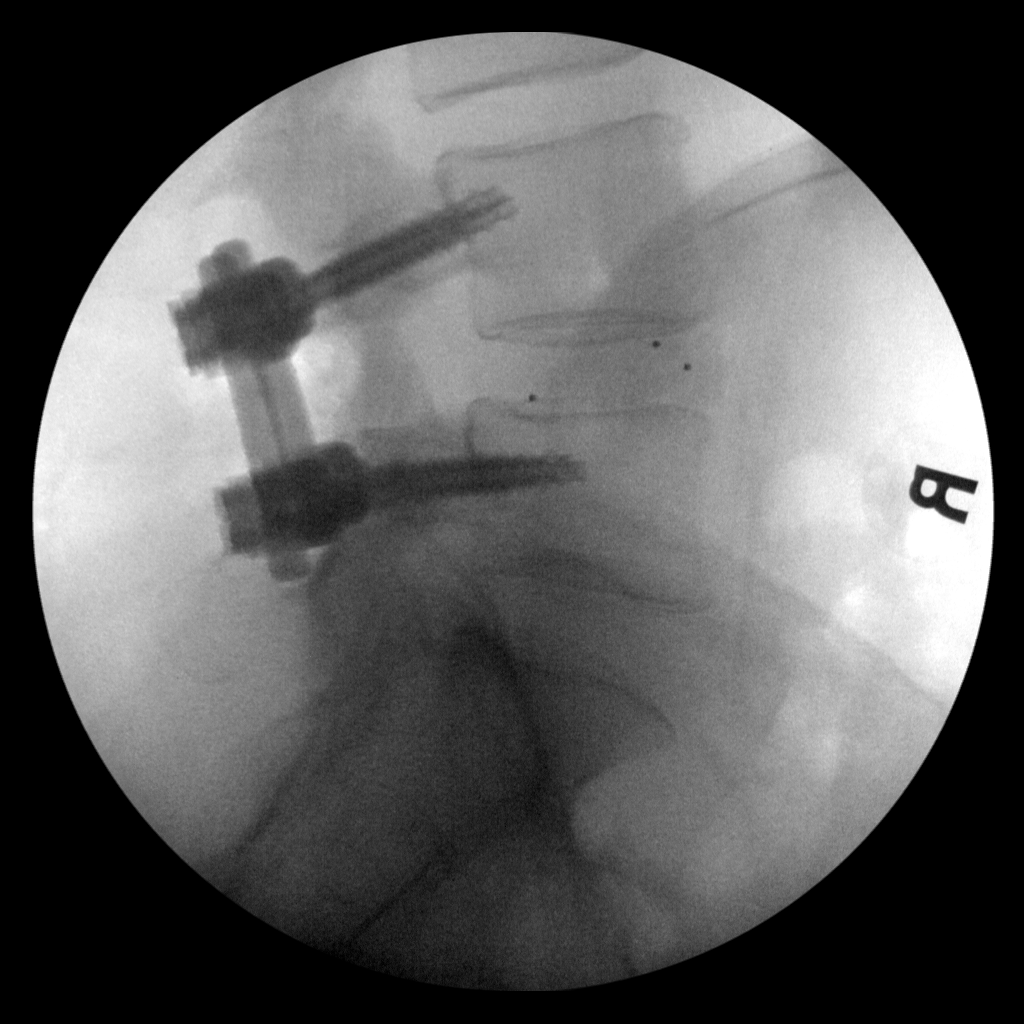
[im 2/2]
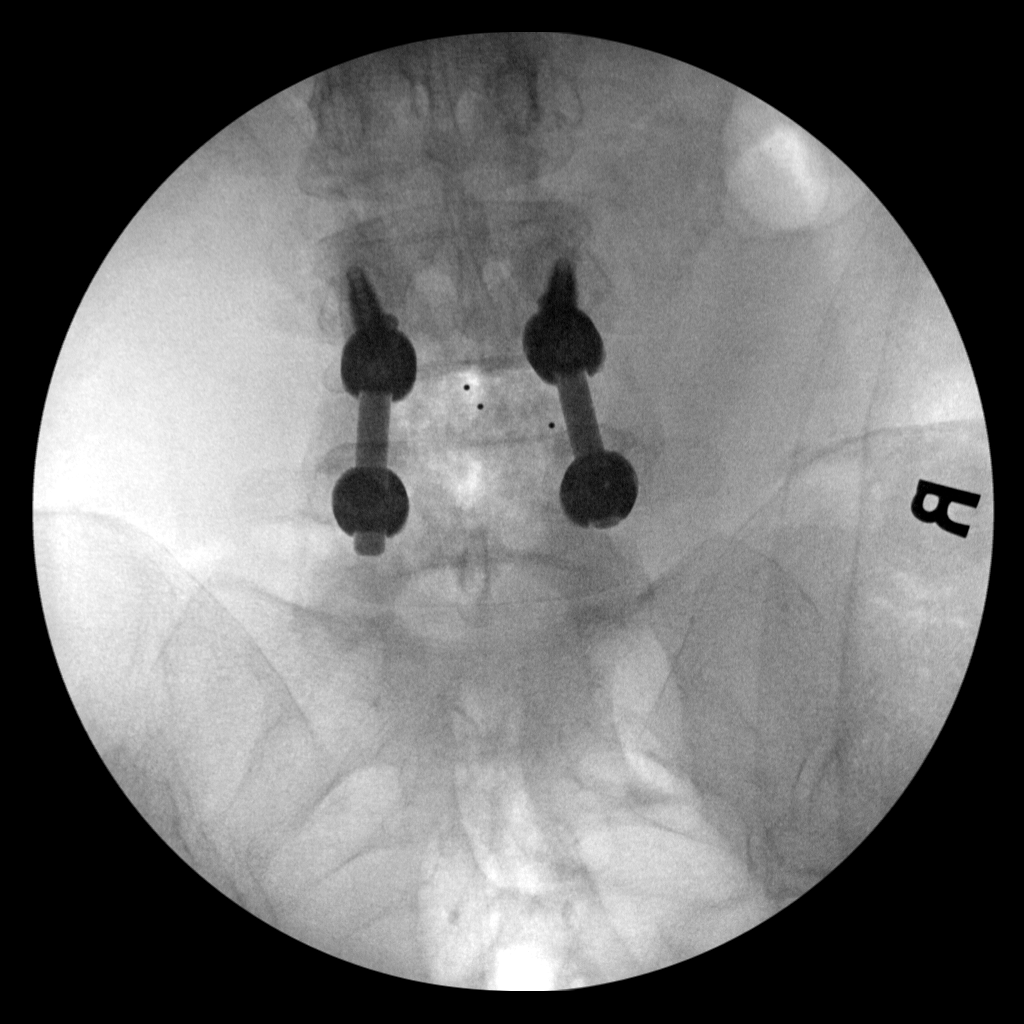

[2 of 2 positions shown; findings below may reference images not displayed]

FINDINGS: Lumbar spine numbered with the lowest segmented appearing lumbar
shaped vertebrae on lateral view as L5. L4-L5 posterior and
interbody fusion. Hardware intact. Anatomic alignment.
IMPRESSION: L4-L5 posterior and interbody fusion.

## 2021-12-23 IMAGING — RF DG LUMBAR SPINE 2-3V
1 series · 2 of 2 positions shown · non-contrast
Comparison: Earlier same day.

CLINICAL DATA: L4-5 TLIF.

EXAM:
LUMBAR SPINE - 2-3 VIEW

[Series 1: run · 2 of 2 slices shown]
[im 1/2]
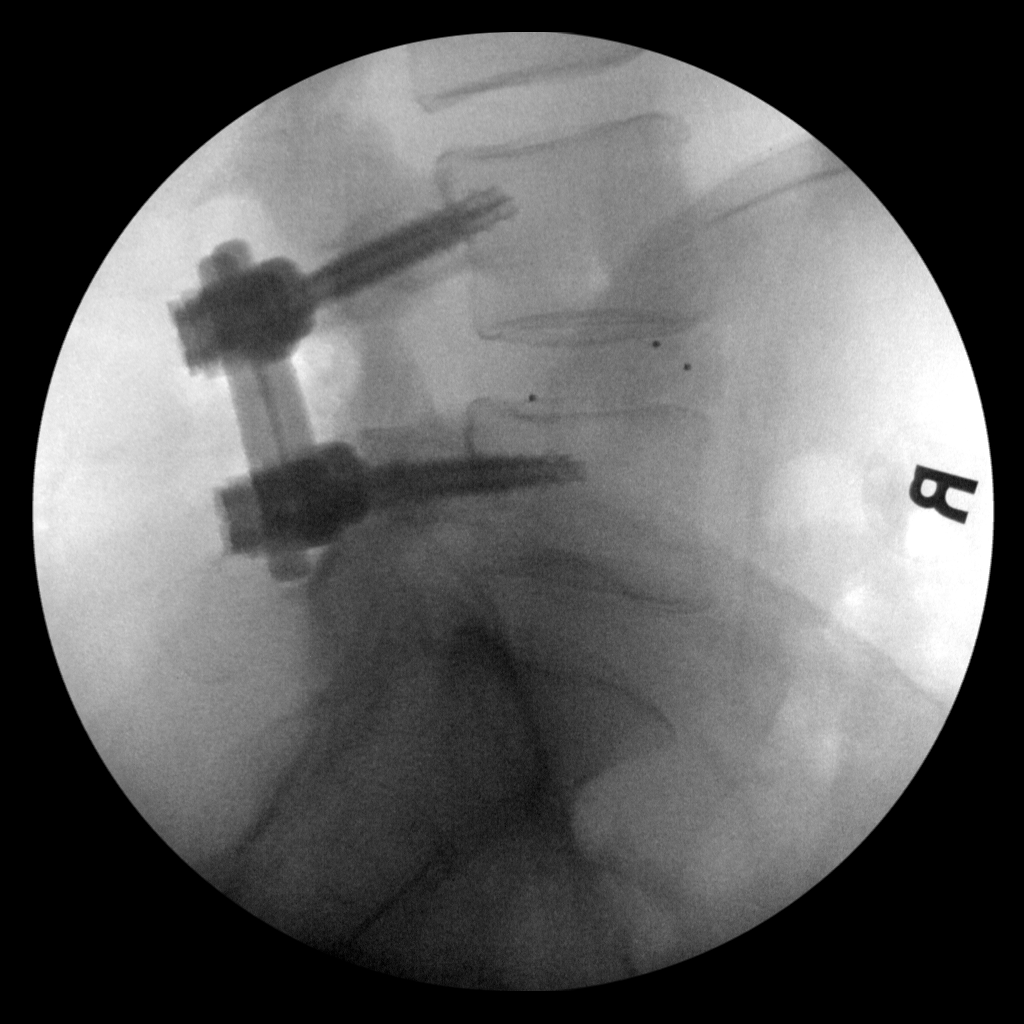
[im 2/2]
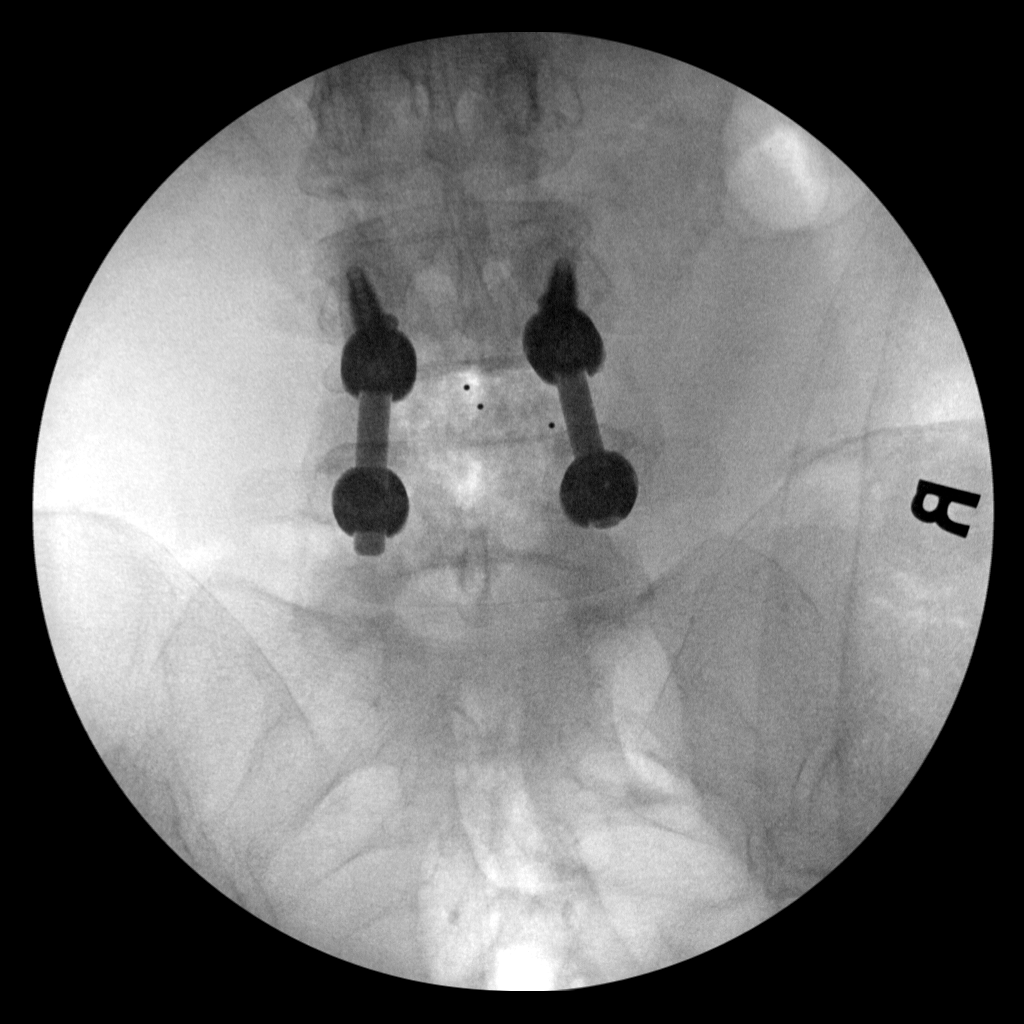

[2 of 2 positions shown; findings below may reference images not displayed]

FINDINGS: Posterior fusion hardware is intact at the L4-5 level. Interbody
fusion at the L4-5 level. Minimal spondylosis of the lumbar spine.
Disc spaces are unremarkable.
IMPRESSION: Posterior fusion hardware intact at the L4-5 level.

## 2022-03-31 DIAGNOSIS — M5416 Radiculopathy, lumbar region: Secondary | ICD-10-CM | POA: Diagnosis not present

## 2022-05-31 ENCOUNTER — Other Ambulatory Visit: Payer: Self-pay | Admitting: Family Medicine

## 2022-05-31 DIAGNOSIS — Z1231 Encounter for screening mammogram for malignant neoplasm of breast: Secondary | ICD-10-CM

## 2022-07-05 ENCOUNTER — Ambulatory Visit
Admission: RE | Admit: 2022-07-05 | Discharge: 2022-07-05 | Disposition: A | Discharge status: Home / Self Care | Payer: BC Managed Care – PPO | Source: Ambulatory Visit | Attending: Family Medicine | Admitting: Family Medicine

## 2022-07-05 DIAGNOSIS — Z1231 Encounter for screening mammogram for malignant neoplasm of breast: Secondary | ICD-10-CM | POA: Diagnosis not present

## 2022-07-18 DIAGNOSIS — Z23 Encounter for immunization: Secondary | ICD-10-CM | POA: Diagnosis not present

## 2022-07-18 DIAGNOSIS — Z0000 Encounter for general adult medical examination without abnormal findings: Secondary | ICD-10-CM | POA: Diagnosis not present

## 2022-07-18 DIAGNOSIS — E039 Hypothyroidism, unspecified: Secondary | ICD-10-CM | POA: Diagnosis not present

## 2022-07-18 DIAGNOSIS — J4520 Mild intermittent asthma, uncomplicated: Secondary | ICD-10-CM | POA: Diagnosis not present

## 2022-07-18 DIAGNOSIS — E782 Mixed hyperlipidemia: Secondary | ICD-10-CM | POA: Diagnosis not present

## 2022-08-24 ENCOUNTER — Encounter (HOSPITAL_BASED_OUTPATIENT_CLINIC_OR_DEPARTMENT_OTHER): Payer: Self-pay | Admitting: Obstetrics & Gynecology

## 2022-08-24 ENCOUNTER — Ambulatory Visit (INDEPENDENT_AMBULATORY_CARE_PROVIDER_SITE_OTHER): Payer: BC Managed Care – PPO | Admitting: Obstetrics & Gynecology

## 2022-08-24 VITALS — BP 134/84 | HR 76 | Ht 60.0 in | Wt 151.0 lb

## 2022-08-24 DIAGNOSIS — E039 Hypothyroidism, unspecified: Secondary | ICD-10-CM | POA: Diagnosis not present

## 2022-08-24 DIAGNOSIS — Z01419 Encounter for gynecological examination (general) (routine) without abnormal findings: Secondary | ICD-10-CM | POA: Diagnosis not present

## 2022-08-24 DIAGNOSIS — B001 Herpesviral vesicular dermatitis: Secondary | ICD-10-CM | POA: Diagnosis not present

## 2022-08-24 DIAGNOSIS — Z803 Family history of malignant neoplasm of breast: Secondary | ICD-10-CM | POA: Insufficient documentation

## 2022-08-24 DIAGNOSIS — Z780 Asymptomatic menopausal state: Secondary | ICD-10-CM | POA: Diagnosis not present

## 2022-08-24 MED ORDER — VALACYCLOVIR HCL 1 G PO TABS: ORAL_TABLET | ORAL | 1 refills | Status: DC

## 2022-08-24 NOTE — Progress Notes (Addendum)
57 y.o. G0P0000 Married White or Caucasian female here for annual exam.  Denies vaginal bleeding.  Doing well.  Holiday was good.    Patient's last menstrual period was 10/20/2016 (approximate).          Sexually active: Yes.    The current method of family planning is post menopausal status.    Exercising: no Smoker:  no  Health Maintenance: Pap:  08/12/2021 Negative History of abnormal Pap:  no MMG:  07/05/2022 Negtive Colonoscopy:  11/17/2020, follow up 7 years BMD:   consider next year Screening Labs: with Dr. Kenton Kingfisher   reports that she has never smoked. She has never used smokeless tobacco. She reports that she does not currently use alcohol. She reports that she does not use drugs.  Past Medical History:  Diagnosis Date   Asthma    Dyspnea    GERD (gastroesophageal reflux disease)    Hyperlipidemia    Hypothyroidism    Mild vitamin D deficiency    PONV (postoperative nausea and vomiting)     Past Surgical History:  Procedure Laterality Date   BREAST EXCISIONAL BIOPSY Right 10/2021   fatty tissue removed   CARPAL TUNNEL RELEASE Bilateral 07/2016, 07/2017   CHOLECYSTECTOMY N/A 07/12/2021   Procedure: LAPAROSCOPIC CHOLECYSTECTOMY WITH INTRAOPERATIVE CHOLANGIOGRAM;  Surgeon: Ralene Ok, MD;  Location: [facility];  Service: General;  Laterality: N/A;   COLONOSCOPY  11/17/2020   MASS EXCISION Right 12/08/2021   Procedure: EXCISION DEEP TISSUE MASS;  Surgeon: Lennice Sites, MD;  Location: [place];  Service: Plastics;  Laterality: Right;   TMJ ARTHROPLASTY     TRANSFORAMINAL LUMBAR INTERBODY FUSION (TLIF) WITH PEDICLE SCREW FIXATION 1 LEVEL Right 11/07/2019   Procedure: RIGHT-SIDED LUMBAR 4-5 TRANSFORAMINAL LUMBAR INTERBODY FUSION WITH INSTRUMENTATION AND ALLOGRAFT;  Surgeon: Phylliss Bob, MD;  Location: [place];  Service: Orthopedics;  Laterality: Right;    Current Outpatient Medications  Medication Sig Dispense Refill   acetaminophen (TYLENOL) 500 MG  tablet Take 1,000 mg by mouth every 6 (six) hours as needed for mild pain.     budesonide-formoterol (SYMBICORT) 160-4.5 MCG/ACT inhaler Inhale 2 puffs into the lungs 2 (two) times daily.      famotidine (PEPCID) 40 MG tablet Take 40 mg by mouth 2 (two) times daily.     levothyroxine (SYNTHROID) 75 MCG tablet Take 75 mcg by mouth daily before breakfast.      Magnesium 500 MG TABS Take 500 mg by mouth daily with supper.     methocarbamol (ROBAXIN) 500 MG tablet Take 500 mg by mouth daily as needed for muscle spasms.     Potassium 99 MG TABS Take 99 mg by mouth daily.     Probiotic TBEC Take 1 tablet by mouth daily.     valACYclovir (VALTREX) 1000 MG tablet Take 1 tabs and repeat in 2 hours as needed for fever blisters. 30 tablet 1   No current facility-administered medications for this visit.    Family History  Problem Relation Age of Onset   Thyroid disease Mother    Asthma Father        uses inhaler   Cancer Father        prostate & blood   Hypertension Sister    Breast cancer Paternal Grandmother    Colon cancer Maternal Grandfather 24   Esophageal cancer Neg Hx    Rectal cancer Neg Hx    Stomach cancer Neg Hx     ROS: Constitutional: negative Genitourinary:negative  Exam:  BP 134/84   Pulse 76   Ht 5' (1.524 m) Comment: Reported  Wt 151 lb (68.5 kg)   LMP 10/20/2016 (Approximate) Comment: Menopasual  BMI 29.49 kg/m   Height: 5' (152.4 cm) (Reported)  General appearance: alert, cooperative and appears stated age Head: Normocephalic, without obvious abnormality, atraumatic Neck: no adenopathy, supple, symmetrical, trachea midline and thyroid normal to inspection and palpation Lungs: clear to auscultation bilaterally Breasts: normal appearance, no masses or tenderness Heart: regular rate and rhythm Abdomen: soft, non-tender; bowel sounds normal; no masses,  no organomegaly Extremities: extremities normal, atraumatic, no cyanosis or edema Skin: Skin color, texture,  turgor normal. No rashes or lesions Lymph nodes: Cervical, supraclavicular, and axillary nodes normal. No abnormal inguinal nodes palpated Neurologic: Grossly normal   Pelvic: External genitalia:  no lesions              Urethra:  normal appearing urethra with no masses, tenderness or lesions              Bartholins and Skenes: normal                 Vagina: normal appearing vagina with normal color and no discharge, no lesions              Cervix: no lesions              Pap taken: No. Bimanual Exam:  Uterus:  normal size, contour, position, consistency, mobility, non-tender              Adnexa: normal adnexa and no mass, fullness, tenderness               Rectovaginal: Confirms               Anus:  normal sphincter tone, no lesions  Chaperone, Octaviano Batty, CMA, was present for exam.  Assessment/Plan: 1. Well woman exam with routine gynecological exam - Pap smear and HR HPV obtained today - Mammogram 06/2022 - Colonoscopy 10/2020.  Follow up 7 years. - Bone mineral density will be considered next year - lab work done with PCP, Dr. Kenton Kingfisher - vaccines reviewed/updated  2. Postmenopausal - not on HRT  3. Fever blister - valACYclovir (VALTREX) 1000 MG tablet; Take 1 tabs and repeat in 2 hours as needed for fever blisters.  Dispense: 30 tablet; Refill: 1  4. Acquired hypothyroidism - on supplementation  5. Family history of breast cancer - 11% lifteime risk with [employer] model

## 2022-10-06 ENCOUNTER — Encounter (HOSPITAL_BASED_OUTPATIENT_CLINIC_OR_DEPARTMENT_OTHER): Payer: Self-pay | Admitting: Obstetrics & Gynecology

## 2022-10-10 ENCOUNTER — Encounter: Payer: Self-pay | Admitting: *Deleted

## 2022-12-28 DIAGNOSIS — R0683 Snoring: Secondary | ICD-10-CM | POA: Diagnosis not present

## 2022-12-28 DIAGNOSIS — T485X5A Adverse effect of other anti-common-cold drugs, initial encounter: Secondary | ICD-10-CM | POA: Diagnosis not present

## 2022-12-28 DIAGNOSIS — R0981 Nasal congestion: Secondary | ICD-10-CM | POA: Diagnosis not present

## 2022-12-28 DIAGNOSIS — J310 Chronic rhinitis: Secondary | ICD-10-CM | POA: Diagnosis not present

## 2023-01-12 ENCOUNTER — Other Ambulatory Visit: Payer: Self-pay | Admitting: Family Medicine

## 2023-01-12 ENCOUNTER — Ambulatory Visit
Admission: RE | Admit: 2023-01-12 | Discharge: 2023-01-12 | Disposition: A | Discharge status: Home / Self Care | Payer: BC Managed Care – PPO | Source: Ambulatory Visit | Attending: Family Medicine | Admitting: Family Medicine

## 2023-01-12 DIAGNOSIS — R0789 Other chest pain: Secondary | ICD-10-CM

## 2023-01-12 DIAGNOSIS — R0602 Shortness of breath: Secondary | ICD-10-CM | POA: Diagnosis not present

## 2023-01-12 DIAGNOSIS — J4520 Mild intermittent asthma, uncomplicated: Secondary | ICD-10-CM | POA: Diagnosis not present

## 2023-01-12 DIAGNOSIS — E039 Hypothyroidism, unspecified: Secondary | ICD-10-CM | POA: Diagnosis not present

## 2023-01-12 DIAGNOSIS — F419 Anxiety disorder, unspecified: Secondary | ICD-10-CM | POA: Diagnosis not present

## 2023-01-31 DIAGNOSIS — R0789 Other chest pain: Secondary | ICD-10-CM | POA: Diagnosis not present

## 2023-01-31 DIAGNOSIS — J4520 Mild intermittent asthma, uncomplicated: Secondary | ICD-10-CM | POA: Diagnosis not present

## 2023-01-31 DIAGNOSIS — F419 Anxiety disorder, unspecified: Secondary | ICD-10-CM | POA: Diagnosis not present

## 2023-01-31 DIAGNOSIS — R0602 Shortness of breath: Secondary | ICD-10-CM | POA: Diagnosis not present

## 2023-03-06 DIAGNOSIS — J339 Nasal polyp, unspecified: Secondary | ICD-10-CM | POA: Diagnosis not present

## 2023-03-06 DIAGNOSIS — R0981 Nasal congestion: Secondary | ICD-10-CM | POA: Diagnosis not present

## 2023-03-06 DIAGNOSIS — J342 Deviated nasal septum: Secondary | ICD-10-CM | POA: Diagnosis not present

## 2023-03-06 DIAGNOSIS — J343 Hypertrophy of nasal turbinates: Secondary | ICD-10-CM | POA: Diagnosis not present

## 2023-03-06 DIAGNOSIS — J329 Chronic sinusitis, unspecified: Secondary | ICD-10-CM | POA: Diagnosis not present

## 2023-03-06 DIAGNOSIS — R0683 Snoring: Secondary | ICD-10-CM | POA: Diagnosis not present

## 2023-03-28 DIAGNOSIS — M25511 Pain in right shoulder: Secondary | ICD-10-CM | POA: Diagnosis not present

## 2023-03-28 DIAGNOSIS — M542 Cervicalgia: Secondary | ICD-10-CM | POA: Diagnosis not present

## 2023-03-28 DIAGNOSIS — M4722 Other spondylosis with radiculopathy, cervical region: Secondary | ICD-10-CM | POA: Diagnosis not present

## 2023-03-29 ENCOUNTER — Institutional Professional Consult (permissible substitution): Payer: BC Managed Care – PPO | Admitting: Pulmonary Disease

## 2023-03-31 ENCOUNTER — Other Ambulatory Visit: Payer: Self-pay | Admitting: Otolaryngology

## 2023-04-04 DIAGNOSIS — M4692 Unspecified inflammatory spondylopathy, cervical region: Secondary | ICD-10-CM | POA: Diagnosis not present

## 2023-04-04 DIAGNOSIS — M542 Cervicalgia: Secondary | ICD-10-CM | POA: Diagnosis not present

## 2023-04-05 ENCOUNTER — Other Ambulatory Visit: Payer: Self-pay | Admitting: Orthopedic Surgery

## 2023-04-05 DIAGNOSIS — M542 Cervicalgia: Secondary | ICD-10-CM

## 2023-04-10 NOTE — Progress Notes (Signed)
Called patient to complete pre-op phone call for surgery scheduled at [facility] on 04/18/23 with Dr. Marene Lenz. Per patient they would like to reschedule their surgery due to a pinched nerve at this time. Dr. Anders Simmonds scheduler [place] made aware.

## 2023-04-11 ENCOUNTER — Ambulatory Visit
Admission: RE | Admit: 2023-04-11 | Discharge: 2023-04-11 | Disposition: A | Discharge status: Home / Self Care | Payer: BC Managed Care – PPO | Source: Ambulatory Visit | Attending: Orthopedic Surgery | Admitting: Orthopedic Surgery

## 2023-04-11 DIAGNOSIS — M542 Cervicalgia: Secondary | ICD-10-CM

## 2023-04-11 DIAGNOSIS — M4802 Spinal stenosis, cervical region: Secondary | ICD-10-CM | POA: Diagnosis not present

## 2023-04-18 ENCOUNTER — Encounter (HOSPITAL_BASED_OUTPATIENT_CLINIC_OR_DEPARTMENT_OTHER): Admission: RE | Payer: Self-pay | Source: Home / Self Care

## 2023-04-18 ENCOUNTER — Ambulatory Visit (HOSPITAL_BASED_OUTPATIENT_CLINIC_OR_DEPARTMENT_OTHER): Admission: RE | Admit: 2023-04-18 | Payer: BC Managed Care – PPO | Source: Home / Self Care | Admitting: Otolaryngology

## 2023-04-18 ENCOUNTER — Ambulatory Visit: Payer: BC Managed Care – PPO | Admitting: Allergy and Immunology

## 2023-04-18 DIAGNOSIS — M5412 Radiculopathy, cervical region: Secondary | ICD-10-CM | POA: Diagnosis not present

## 2023-04-18 SURGERY — SINUS ENDO WITH FUSION
Anesthesia: General | Laterality: Bilateral

## 2023-04-28 DIAGNOSIS — M5412 Radiculopathy, cervical region: Secondary | ICD-10-CM | POA: Diagnosis not present

## 2023-05-02 DIAGNOSIS — G8929 Other chronic pain: Secondary | ICD-10-CM | POA: Diagnosis not present

## 2023-05-02 DIAGNOSIS — M4802 Spinal stenosis, cervical region: Secondary | ICD-10-CM | POA: Diagnosis not present

## 2023-05-02 DIAGNOSIS — M5441 Lumbago with sciatica, right side: Secondary | ICD-10-CM | POA: Diagnosis not present

## 2023-05-02 DIAGNOSIS — Z23 Encounter for immunization: Secondary | ICD-10-CM | POA: Diagnosis not present

## 2023-05-04 DIAGNOSIS — M5412 Radiculopathy, cervical region: Secondary | ICD-10-CM | POA: Diagnosis not present

## 2023-05-08 DIAGNOSIS — M5412 Radiculopathy, cervical region: Secondary | ICD-10-CM | POA: Diagnosis not present

## 2023-05-09 ENCOUNTER — Ambulatory Visit: Payer: BC Managed Care – PPO | Admitting: Allergy and Immunology

## 2023-05-11 DIAGNOSIS — M5412 Radiculopathy, cervical region: Secondary | ICD-10-CM | POA: Diagnosis not present

## 2023-05-12 DIAGNOSIS — M5412 Radiculopathy, cervical region: Secondary | ICD-10-CM | POA: Diagnosis not present

## 2023-05-18 DIAGNOSIS — L237 Allergic contact dermatitis due to plants, except food: Secondary | ICD-10-CM | POA: Diagnosis not present

## 2023-05-18 DIAGNOSIS — R030 Elevated blood-pressure reading, without diagnosis of hypertension: Secondary | ICD-10-CM | POA: Diagnosis not present

## 2023-06-23 DIAGNOSIS — M542 Cervicalgia: Secondary | ICD-10-CM | POA: Diagnosis not present

## 2023-06-28 ENCOUNTER — Other Ambulatory Visit: Payer: Self-pay | Admitting: Family Medicine

## 2023-06-28 DIAGNOSIS — Z1231 Encounter for screening mammogram for malignant neoplasm of breast: Secondary | ICD-10-CM

## 2023-07-31 ENCOUNTER — Ambulatory Visit: Payer: BC Managed Care – PPO

## 2023-08-04 DIAGNOSIS — E782 Mixed hyperlipidemia: Secondary | ICD-10-CM | POA: Diagnosis not present

## 2023-08-04 DIAGNOSIS — J4520 Mild intermittent asthma, uncomplicated: Secondary | ICD-10-CM | POA: Diagnosis not present

## 2023-08-04 DIAGNOSIS — E039 Hypothyroidism, unspecified: Secondary | ICD-10-CM | POA: Diagnosis not present

## 2023-08-04 DIAGNOSIS — K219 Gastro-esophageal reflux disease without esophagitis: Secondary | ICD-10-CM | POA: Diagnosis not present

## 2023-08-04 DIAGNOSIS — Z0000 Encounter for general adult medical examination without abnormal findings: Secondary | ICD-10-CM | POA: Diagnosis not present

## 2023-08-11 ENCOUNTER — Ambulatory Visit
Admission: RE | Admit: 2023-08-11 | Discharge: 2023-08-11 | Disposition: A | Discharge status: Home / Self Care | Payer: BC Managed Care – PPO | Source: Ambulatory Visit | Attending: Family Medicine | Admitting: Family Medicine

## 2023-08-11 DIAGNOSIS — Z1231 Encounter for screening mammogram for malignant neoplasm of breast: Secondary | ICD-10-CM

## 2023-09-01 ENCOUNTER — Encounter (HOSPITAL_BASED_OUTPATIENT_CLINIC_OR_DEPARTMENT_OTHER): Payer: Self-pay | Admitting: Obstetrics & Gynecology

## 2023-09-01 ENCOUNTER — Ambulatory Visit (HOSPITAL_BASED_OUTPATIENT_CLINIC_OR_DEPARTMENT_OTHER): Payer: BC Managed Care – PPO | Admitting: Obstetrics & Gynecology

## 2023-09-01 VITALS — BP 120/80 | HR 79 | Ht 60.0 in | Wt 150.2 lb

## 2023-09-01 DIAGNOSIS — B001 Herpesviral vesicular dermatitis: Secondary | ICD-10-CM | POA: Diagnosis not present

## 2023-09-01 DIAGNOSIS — E039 Hypothyroidism, unspecified: Secondary | ICD-10-CM

## 2023-09-01 DIAGNOSIS — Z01419 Encounter for gynecological examination (general) (routine) without abnormal findings: Secondary | ICD-10-CM | POA: Diagnosis not present

## 2023-09-01 DIAGNOSIS — Z780 Asymptomatic menopausal state: Secondary | ICD-10-CM | POA: Diagnosis not present

## 2023-09-01 DIAGNOSIS — Z803 Family history of malignant neoplasm of breast: Secondary | ICD-10-CM

## 2023-09-01 NOTE — Progress Notes (Signed)
 58 y.o. G0P0000 Married White or Caucasian female here for annual exam.  Doing well.  Denies vaginal bleeding.  Having issues with sinus issues.  Had CT that showed significant polyps.  Surgery was scheduled but needed to be rescheduled due to disk issues which are now improved.  Will plan to do this in 2025.  Patient's last menstrual period was 10/20/2016 (approximate).          Sexually active: Yes.    The current method of family planning is post menopausal status.    Smoker:  no  Health Maintenance: Pap:  08/12/21 neg History of abnormal Pap:  no MMG:  08/11/23 Colonoscopy:  11/17/20, follow up 7 years BMD:   not indicated yet Screening Labs: done in December with Dr. Arloa.  Total cholesterol was 251.  <2% lifetime risk with ACC/AHA calculator.     reports that she has never smoked. She has never used smokeless tobacco. She reports that she does not currently use alcohol. She reports that she does not use drugs.  Past Medical History:  Diagnosis Date   Asthma    Dyspnea    GERD (gastroesophageal reflux disease)    Hyperlipidemia    Hypothyroidism    Mild vitamin D  deficiency    Nasal polyps    PONV (postoperative nausea and vomiting)     Past Surgical History:  Procedure Laterality Date   BREAST EXCISIONAL BIOPSY Right 10/2021   fatty tissue removed   CARPAL TUNNEL RELEASE Bilateral 07/2016, 07/2017   CHOLECYSTECTOMY N/A 07/12/2021   Procedure: LAPAROSCOPIC CHOLECYSTECTOMY WITH INTRAOPERATIVE CHOLANGIOGRAM;  Surgeon: Rubin Calamity, MD;  Location: [facility];  Service: General;  Laterality: N/A;   COLONOSCOPY  11/17/2020   MASS EXCISION Right 12/08/2021   Procedure: EXCISION DEEP TISSUE MASS;  Surgeon: Marene Sieving, MD;  Location: [facility];  Service: Plastics;  Laterality: Right;   TMJ ARTHROPLASTY     TRANSFORAMINAL LUMBAR INTERBODY FUSION (TLIF) WITH PEDICLE SCREW FIXATION 1 LEVEL Right 11/07/2019   Procedure: RIGHT-SIDED LUMBAR 4-5 TRANSFORAMINAL  LUMBAR INTERBODY FUSION WITH INSTRUMENTATION AND ALLOGRAFT;  Surgeon: Beuford Anes, MD;  Location[facility];  Service: Orthopedics;  Laterality: Right;    Current Outpatient Medications  Medication Sig Dispense Refill   acetaminophen  (TYLENOL ) 500 MG tablet Take 1,000 mg by mouth every 6 (six) hours as needed for mild pain.     budesonide-formoterol (SYMBICORT) 160-4.5 MCG/ACT inhaler Inhale 2 puffs into the lungs 2 (two) times daily.      famotidine  (PEPCID ) 40 MG tablet Take 40 mg by mouth 2 (two) times daily.     levothyroxine  (SYNTHROID ) 75 MCG tablet Take 75 mcg by mouth daily before breakfast.      methocarbamol  (ROBAXIN ) 500 MG tablet Take 500 mg by mouth daily as needed for muscle spasms.     valACYclovir  (VALTREX ) 1000 MG tablet Take 1 tabs and repeat in 2 hours as needed for fever blisters. 30 tablet 1   escitalopram (LEXAPRO) 5 MG tablet Take 5 mg by mouth daily.     gabapentin  (NEURONTIN ) 300 MG capsule Take 300 mg by mouth 2 (two) times daily. (Patient not taking: Reported on 09/01/2023)     No current facility-administered medications for this visit.    Family History  Problem Relation Age of Onset   Thyroid  disease Mother    Asthma Father        uses inhaler   Cancer Father        blood   Prostate cancer Father  Hypertension Sister    Colon cancer Maternal Grandfather 28   Breast cancer Paternal Grandmother    Esophageal cancer Neg Hx    Rectal cancer Neg Hx    Stomach cancer Neg Hx     ROS: Constitutional: negative Genitourinary:negative  Exam:   Ht 5' (1.524 m)   Wt 150 lb 3.2 oz (68.1 kg)   LMP 10/20/2016 (Approximate) Comment: Menopasual  BMI 29.33 kg/m   Height: 5' (152.4 cm)  General appearance: alert, cooperative and appears stated age Head: Normocephalic, without obvious abnormality, atraumatic Neck: no adenopathy, supple, symmetrical, trachea midline and thyroid  normal to inspection and palpation Lungs: clear to auscultation bilaterally Breasts:  normal appearance, no masses or tenderness Heart: regular rate and rhythm Abdomen: soft, non-tender; bowel sounds normal; no masses,  no organomegaly Extremities: extremities normal, atraumatic, no cyanosis or edema Skin: Skin color, texture, turgor normal. No rashes or lesions Lymph nodes: Cervical, supraclavicular, and axillary nodes normal. No abnormal inguinal nodes palpated Neurologic: Grossly normal   Pelvic: External genitalia:  no lesions              Urethra:  normal appearing urethra with no masses, tenderness or lesions              Bartholins and Skenes: normal                 Vagina: normal appearing vagina with normal color and no discharge, no lesions              Cervix: no lesions              Pap taken: No. Bimanual Exam:  Uterus:  normal size, contour, position, consistency, mobility, non-tender              Adnexa: normal adnexa and no mass, fullness, tenderness               Rectovaginal: Confirms               Anus:  normal sphincter tone, no lesions  Chaperone, Luke Corona, RN, was present for exam.  Assessment/Plan: 1. Well woman exam with routine gynecological exam (Primary) - Pap smear 08/12/2021 - Mammogram 07/2023 - Colonoscopy 2022, follow up 7 years  - Bone mineral density guidelines reviewed - lab work done with PCP, Dr. Arloa - vaccines reviewed/updated  2. Postmenopausal - not on HRT  3. Fever blister - does not need valtrex  rx  4. Family history of breast cancer - lifetime risk calculation 11% with [employer] model  5. Acquired hypothyroidism - on replacement therapy

## 2023-11-14 DIAGNOSIS — E663 Overweight: Secondary | ICD-10-CM | POA: Diagnosis not present

## 2023-11-14 DIAGNOSIS — F419 Anxiety disorder, unspecified: Secondary | ICD-10-CM | POA: Diagnosis not present

## 2023-11-27 DIAGNOSIS — J339 Nasal polyp, unspecified: Secondary | ICD-10-CM | POA: Diagnosis not present

## 2023-11-27 DIAGNOSIS — J329 Chronic sinusitis, unspecified: Secondary | ICD-10-CM | POA: Diagnosis not present

## 2023-11-27 DIAGNOSIS — R0981 Nasal congestion: Secondary | ICD-10-CM | POA: Diagnosis not present

## 2023-11-27 DIAGNOSIS — J343 Hypertrophy of nasal turbinates: Secondary | ICD-10-CM | POA: Diagnosis not present

## 2023-12-26 DIAGNOSIS — F419 Anxiety disorder, unspecified: Secondary | ICD-10-CM | POA: Diagnosis not present

## 2023-12-27 DIAGNOSIS — J329 Chronic sinusitis, unspecified: Secondary | ICD-10-CM | POA: Diagnosis not present

## 2023-12-27 DIAGNOSIS — J343 Hypertrophy of nasal turbinates: Secondary | ICD-10-CM | POA: Diagnosis not present

## 2023-12-27 DIAGNOSIS — J342 Deviated nasal septum: Secondary | ICD-10-CM | POA: Diagnosis not present

## 2023-12-27 DIAGNOSIS — Z01818 Encounter for other preprocedural examination: Secondary | ICD-10-CM | POA: Diagnosis not present

## 2023-12-27 DIAGNOSIS — J3489 Other specified disorders of nose and nasal sinuses: Secondary | ICD-10-CM | POA: Diagnosis not present

## 2023-12-27 DIAGNOSIS — J339 Nasal polyp, unspecified: Secondary | ICD-10-CM | POA: Diagnosis not present

## 2024-01-08 DIAGNOSIS — R197 Diarrhea, unspecified: Secondary | ICD-10-CM | POA: Diagnosis not present

## 2024-01-30 ENCOUNTER — Encounter: Payer: Self-pay | Admitting: Gastroenterology

## 2024-03-08 ENCOUNTER — Encounter: Payer: Self-pay | Admitting: Gastroenterology

## 2024-03-08 ENCOUNTER — Ambulatory Visit: Admitting: Gastroenterology

## 2024-03-08 ENCOUNTER — Other Ambulatory Visit (INDEPENDENT_AMBULATORY_CARE_PROVIDER_SITE_OTHER)

## 2024-03-08 VITALS — BP 118/70 | HR 77 | Ht 60.0 in | Wt 146.0 lb

## 2024-03-08 DIAGNOSIS — R197 Diarrhea, unspecified: Secondary | ICD-10-CM

## 2024-03-08 DIAGNOSIS — R194 Change in bowel habit: Secondary | ICD-10-CM

## 2024-03-08 LAB — TSH: TSH: 1.59 u[IU]/mL (ref 0.35–5.50)

## 2024-03-08 NOTE — Progress Notes (Signed)
 Sara Nichols [id number] [birth year]   Chief Complaint: Diarrhea  Referring Provider: Arloa Elsie SAUNDERS, MD Primary GI MD: Dr. Shila  HPI: Sara Nichols is a 58 y.o. female with past medical history of asthma, GERD, HLD, hypothyroidism, vitamin D  deficiency, cholecystectomy 2022, adenomatous colon polyps who presents today for a complaint of diarrhea.    PCP Dr. Arloa with [facility].  Labs 08/10/2023: Normal TSH, normal CBC, normal CMP, elevated cholesterol  Negative GI pathogen panel 01/12/2024.  Patient states she started going to [employer] this year and was started on supplements to include an energy drink and a liver cleanse.  She has brought these with her today: Sarina [place] and [employer] Power of 3.  She states that after taking these in March she began having watery diarrhea.  Diarrhea persisted for a while.  She decided to discontinue the supplements and bowel movements gradually were becoming more formed.  She started the energy drink solution again and seemed to do okay with this, but when she tried taking the capsule again she had a recurrence of watery diarrhea.  She stopped taking both supplements about 2 weeks ago.  States that stools may be getting a little more formed but are still predominantly loose.  She is having 2-3 bowel movements daily.  States that initially when diarrhea began she would pass watery stools every time she went to the bathroom to urinate.  This has improved.  She had stool testing which was negative for infection as previously stated.  That was back in May.  States she may have lost a few pounds but denies any significant weight loss.  She has a normal appetite.  Denies any other associated symptoms including abdominal pain, nausea, vomiting, fever, chills. Denies any blood in her stool or melena.  She has had some fecal urgency but denies incontinence.  Prior to onset of diarrhea she was having 2-3  bowel movements per week.  She has history of cholecystectomy and after this was having more frequent bowel movements but no diarrhea.  Stools were formed.  Occasionally she would have to use suppositories but denies any history of straining with bowel movements.    She recently started on Dupixent for asthma, otherwise denies medication changes recently, though she has started an OTC probiotic [employer]) which seems to be helping with diarrhea.  Denies any sick contacts, no one else with similar symptoms.  Previous GI Procedures/Imaging   Intraoperative cholangiogram 07/12/2021 - Cholangiogram without biliary ductal dilation or discrete filling defect.  RUQ US  07/12/2021 1. Cholelithiasis without sonographic evidence of acute cholecystitis. 2. 1.0 cm rounded LEFT hepatic lobe lesion. Findings likely to represent a small hepatic hemangioma.  Colonoscopy 11/17/2020 - Two 6 to 8 mm polyps in the transverse colon, removed with a cold snare. Resected and retrieved.  - Diverticulosis in the sigmoid colon, in the descending colon, in the transverse colon and in the ascending colon.  - Non- bleeding external and internal hemorrhoids.  - The examination was otherwise normal. - Recall 7 years Path: Surgical [P], colon, transverse, polyp (2) - TUBULAR ADENOMA (2). - NO HIGH GRADE DYSPLASIA OR CARCINOMA.  Past Medical History:  Diagnosis Date   Asthma    Dyspnea    GERD (gastroesophageal reflux disease)    Hyperlipidemia    Hypothyroidism    Mild vitamin D  deficiency    Nasal polyps    PONV (postoperative nausea and vomiting)     Past Surgical History:  Procedure  Laterality Date   BREAST EXCISIONAL BIOPSY Right 10/2021   fatty tissue removed   CARPAL TUNNEL RELEASE Bilateral 07/2016, 07/2017   CHOLECYSTECTOMY N/A 07/12/2021   Procedure: LAPAROSCOPIC CHOLECYSTECTOMY WITH INTRAOPERATIVE CHOLANGIOGRAM;  Surgeon: Rubin Calamity, MD;  Location: [facility];  Service: General;  Laterality:  N/A;   COLONOSCOPY  11/17/2020   MASS EXCISION Right 12/08/2021   Procedure: EXCISION DEEP TISSUE MASS;  Surgeon: Marene Sieving, MD;  Location: [facility];  Service: Plastics;  Laterality: Right;   TMJ ARTHROPLASTY     TRANSFORAMINAL LUMBAR INTERBODY FUSION (TLIF) WITH PEDICLE SCREW FIXATION 1 LEVEL Right 11/07/2019   Procedure: RIGHT-SIDED LUMBAR 4-5 TRANSFORAMINAL LUMBAR INTERBODY FUSION WITH INSTRUMENTATION AND ALLOGRAFT;  Surgeon: Beuford Anes, MD;  Location: [facility];  Service: Orthopedics;  Laterality: Right;    Current Outpatient Medications  Medication Sig Dispense Refill   acetaminophen  (TYLENOL ) 500 MG tablet Take 1,000 mg by mouth every 6 (six) hours as needed for mild pain.     budesonide-formoterol (SYMBICORT) 160-4.5 MCG/ACT inhaler Inhale 2 puffs into the lungs 2 (two) times daily.      escitalopram (LEXAPRO) 5 MG tablet Take 5 mg by mouth daily.     famotidine  (PEPCID ) 40 MG tablet Take 40 mg by mouth 2 (two) times daily.     gabapentin  (NEURONTIN ) 300 MG capsule Take 300 mg by mouth 2 (two) times daily. (Patient not taking: Reported on 09/01/2023)     levothyroxine  (SYNTHROID ) 75 MCG tablet Take 75 mcg by mouth daily before breakfast.      methocarbamol  (ROBAXIN ) 500 MG tablet Take 500 mg by mouth daily as needed for muscle spasms.     valACYclovir  (VALTREX ) 1000 MG tablet Take 1 tabs and repeat in 2 hours as needed for fever blisters. 30 tablet 1   No current facility-administered medications for this visit.    Allergies as of 03/08/2024   (No Known Allergies)    Family History  Problem Relation Age of Onset   Thyroid  disease Mother    Asthma Father        uses inhaler   Cancer Father        blood   Prostate cancer Father    Hypertension Sister    Colon cancer Maternal Grandfather 53   Breast cancer Paternal Grandmother    Esophageal cancer Neg Hx    Rectal cancer Neg Hx    Stomach cancer Neg Hx     Social History   Tobacco Use   Smoking  status: Never   Smokeless tobacco: Never  Vaping Use   Vaping status: Never Used  Substance Use Topics   Alcohol use: Not Currently   Drug use: No     Review of Systems:    Constitutional: No unexplained weight loss, fever, chills, weakness or fatigue Skin: No rash or itching Cardiovascular: No chest pain, chest pressure or palpitations   Respiratory: No SOB or cough Gastrointestinal: See HPI and otherwise negative Genitourinary: No dysuria or change in urinary frequency Neurological: No headache, dizziness or syncope Musculoskeletal: No new muscle or joint pain Hematologic: No bleeding or bruising    Physical Exam:  Vital signs: BP 118/70   Pulse 77   Ht 5' (1.524 m)   Wt 146 lb (66.2 kg)   LMP 10/20/2016 (Approximate) Comment: Menopasual  BMI 28.51 kg/m    Constitutional: Pleasant, well-appearing female in NAD, alert and cooperative Head:  Normocephalic and atraumatic.  Eyes: No scleral icterus.  Mouth: No oral lesions. Respiratory: Respirations  even and unlabored. Lungs clear to auscultation bilaterally.  No wheezes, crackles, or rhonchi.  Cardiovascular:  Regular rate and rhythm. No murmurs. No peripheral edema. Gastrointestinal:  Soft, nondistended, nontender. No rebound or guarding. Normal bowel sounds. No appreciable masses or hepatomegaly. Rectal:  Not performed.  Neurologic:  Alert and oriented x4;  grossly normal neurologically.  Skin:   Dry and intact without significant lesions or rashes. Psychiatric: Oriented to person, place and time. Demonstrates good judgement and reason without abnormal affect or behaviors.   RELEVANT LABS AND IMAGING: CBC    Component Value Date/Time   WBC 6.0 07/12/2021 0952   RBC 5.08 07/12/2021 0952   HGB 14.9 07/12/2021 0952   HGB 14.9 01/11/2018 1540   HCT 45.2 07/12/2021 0952   HCT 45.7 01/11/2018 1540   PLT 341 07/12/2021 0952   PLT 353 01/11/2018 1540   MCV 89.0 07/12/2021 0952   MCV 91 01/11/2018 1540   MCH 29.3  07/12/2021 0952   MCHC 33.0 07/12/2021 0952   RDW 14.2 07/12/2021 0952   RDW 13.9 01/11/2018 1540   LYMPHSABS 1.5 11/05/2019 0943   MONOABS 0.7 11/05/2019 0943   EOSABS 0.5 11/05/2019 0943   BASOSABS 0.1 11/05/2019 0943    CMP     Component Value Date/Time   NA 140 08/12/2021 0936   K 4.2 08/12/2021 0936   CL 106 08/12/2021 0936   CO2 20 08/12/2021 0936   GLUCOSE 91 08/12/2021 0936   GLUCOSE 107 (H) 07/12/2021 0952   BUN 11 08/12/2021 0936   CREATININE 0.85 08/12/2021 0936   CALCIUM 9.7 08/12/2021 0936   PROT 7.2 08/12/2021 0936   ALBUMIN 4.7 08/12/2021 0936   AST 35 08/12/2021 0936   ALT 41 (H) 08/12/2021 0936   ALKPHOS 108 08/12/2021 0936   BILITOT 0.3 08/12/2021 0936   GFRNONAA 51 (L) 07/12/2021 0952   GFRAA >60 11/05/2019 0943     Assessment/Plan:   Change in bowel habits Diarrhea Patient with diarrhea ongoing since March.  She denies any other associated symptoms including fever, chills, nausea, vomiting, abdominal pain, rectal bleeding, melena. She has history of cholecystectomy, but denies problems with diarrhea following surgery, and prior to symptom onset she had been having normal, formed bowel movements regularly.  She has had a negative GI pathogen panel 12/2023.  Last colonoscopy 2022 with 7-year recall recommended due to finding of 2 adenomatous polyps.  The timing of her symptoms with onset of diarrhea after starting new supplements, some improvement after discontinuing supplements, and recurrence when she restarted them, points to possibility of diarrhea as side effect of supplements.     She stopped taking supplements about 2 weeks ago and has started to notice some improvement in the consistency of her stool. I have advised that she avoid them in future but will pursue further workup to rule out other etiologies.  - Discontinue supplements. - Check labs today: TTG, IgA, TSH, fecal calprotectin - If diarrhea persists consider starting cholestyramine  with history of cholecystectomy.  Could also consider trial of Xifaxan. - If no improvement in diarrhea with the above, consider repeat colonoscopy with biopsies.   Camie Furbish, PA-C [facility] 03/08/2024, 12:18 PM  Patient Care Team: Arloa Elsie SAUNDERS, MD as PCP - General (Family Medicine)

## 2024-03-08 NOTE — Patient Instructions (Signed)
 Please discontinue the supplements we discussed today.  Your provider has requested that you go to the basement level for lab work before leaving today. Press B on the elevator. The lab is located at the first door on the left as you exit the elevator.  Due to recent changes in healthcare laws, you may see the results of your imaging and laboratory studies on MyChart before your provider has had a chance to review them.  We understand that in some cases there may be results that are confusing or concerning to you. Not all laboratory results come back in the same time frame and the provider may be waiting for multiple results in order to interpret others.  Please give us  48 hours in order for your provider to thoroughly review all the results before contacting the office for clarification of your results.   Follow up in 6 weeks.  Thank you for trusting me with your gastrointestinal care!   Camie Furbish, PA-C  _______________________________________________________  If your blood pressure at your visit was 140/90 or greater, please contact your primary care physician to follow up on this.  _______________________________________________________  If you are age 56 or older, your body mass index should be between 23-30. Your Body mass index is 28.51 kg/m. If this is out of the aforementioned range listed, please consider follow up with your Primary Care Provider.  If you are age 35 or younger, your body mass index should be between 19-25. Your Body mass index is 28.51 kg/m. If this is out of the aformentioned range listed, please consider follow up with your Primary Care Provider.   ________________________________________________________  The [facility] providers would like to encourage you to use MYCHART to communicate with providers for non-urgent requests or questions.  Due to long hold times on the telephone, sending your provider a message by [facility] may be a faster and more efficient way  to get a response.  Please allow 48 business hours for a response.  Please remember that this is for non-urgent requests.  _______________________________________________________

## 2024-03-09 LAB — IGA: Immunoglobulin A: 374 mg/dL — ABNORMAL HIGH (ref 47–310)

## 2024-03-09 LAB — TISSUE TRANSGLUTAMINASE ABS,IGG,IGA
(tTG) Ab, IgA: <1 U/mL
(tTG) Ab, IgG: <1 U/mL

## 2024-03-11 ENCOUNTER — Other Ambulatory Visit

## 2024-03-11 ENCOUNTER — Ambulatory Visit: Payer: Self-pay | Admitting: Gastroenterology

## 2024-03-11 DIAGNOSIS — R197 Diarrhea, unspecified: Secondary | ICD-10-CM | POA: Diagnosis not present

## 2024-03-11 DIAGNOSIS — R194 Change in bowel habit: Secondary | ICD-10-CM | POA: Diagnosis not present

## 2024-03-13 LAB — CALPROTECTIN, FECAL: Calprotectin, Fecal: 41 ug/g (ref 0–120)

## 2024-04-29 ENCOUNTER — Ambulatory Visit: Admitting: Gastroenterology

## 2024-07-29 DIAGNOSIS — J339 Nasal polyp, unspecified: Secondary | ICD-10-CM | POA: Diagnosis not present

## 2024-07-29 DIAGNOSIS — R0981 Nasal congestion: Secondary | ICD-10-CM | POA: Diagnosis not present

## 2024-07-29 DIAGNOSIS — J330 Polyp of nasal cavity: Secondary | ICD-10-CM | POA: Diagnosis not present

## 2024-07-29 DIAGNOSIS — J329 Chronic sinusitis, unspecified: Secondary | ICD-10-CM | POA: Diagnosis not present

## 2024-08-29 NOTE — Progress Notes (Signed)
 "  ANNUAL EXAM Patient name: Sara Nichols MRN [id number]  Date of birth: 1965/09/29 Chief Complaint:   Gynecologic Exam  History of Present Illness:   Sara Nichols is a 59 y.o. G0P0000 Caucasian female being seen today for a routine annual exam.   Father passed unexpectedly this week.  He had multiple myeloma but developed sepsis.  She was with him.  Her sister was with him.  He was 57 yo.    She is on lexapro and feels this is helping.  Does not feel she needs anything else at this time.  Denies vaginal bleeding.    Patient's last menstrual period was 10/20/2016.   Last pap 08/12/2021. Results were: NILM w/ HRHPV negative. H/O abnormal pap: no Last mammogram: 08/11/2023. Results were: normal. Patient needs to schedule one. Family h/o breast cancer: yes paternal grandmother Last colonoscopy: 11/17/2020. Results were: abnormal two polyps. Family h/o colorectal cancer: yes maternal grandmother.  Follow up 7 years.       08/30/2024    8:12 AM 09/01/2023    8:42 AM 08/24/2022    8:18 AM 08/12/2021    8:31 AM  Depression screen PHQ 2/9  Decreased Interest 1 0 0 0  Down, Depressed, Hopeless 1 0 0 0  PHQ - 2 Score 2 0 0 0  Altered sleeping 1     Tired, decreased energy 1     Change in appetite 1     Feeling bad or failure about yourself  0     Trouble concentrating 0     Moving slowly or fidgety/restless 0     Suicidal thoughts 0     PHQ-9 Score 5     Difficult doing work/chores Not difficult at all       Review of Systems:   Pertinent items are noted in HPI Denies any urinary or bowel changes or pelvic pain.   Pertinent History Reviewed:  Reviewed past medical,surgical, social and family history.  Reviewed problem list, medications and allergies. Physical Assessment:   Vitals:   08/30/24 0807  BP: 121/84  Pulse: 68  SpO2: 100%  Weight: 150 lb 9.6 oz (68.3 kg)  Height: 5' (1.524 m)  Body mass index is 29.41 kg/m.        Physical Examination:   General  appearance - well appearing, and in no distress  Mental status - alert, oriented to person, place, and time  Psych:  She has a normal mood and affect  Skin - warm and dry, normal color, no suspicious lesions noted  Chest - effort normal, all lung fields clear to auscultation bilaterally  Heart - normal rate and regular rhythm  Neck:  midline trachea, no thyromegaly or nodules  Breasts - breasts appear normal, no suspicious masses, no skin or nipple changes or  axillary nodes  Abdomen - soft, nontender, nondistended, no masses or organomegaly  Pelvic - VULVA: normal appearing vulva with no masses, tenderness, sebaceous cyst noted on right labia external   VAGINA: normal appearing vagina with normal color and discharge, no lesions   CERVIX: normal appearing cervix without discharge or lesions, no CMT  Thin prep pap with HR HPV obtained today  UTERUS: uterus is felt to be normal size, shape, consistency and nontender   ADNEXA: No adnexal masses or tenderness noted.  Rectal - normal rectal, good sphincter tone, no masses felt  Extremities:  No swelling or varicosities noted  Chaperone present for exam  No results found for this or any  previous visit (from the past 24 hours).  Assessment & Plan:  1. Well woman exam with routine gynecological exam (Primary) - Pap smear with HR HPV - Mammogram 07/2023 - Colonoscopy 2022 - lab work done with PCP, Dr. Arloa - vaccines reviewed/updated - pt will return for excision of vulvar lesion at her request.  Aware this is benign.  2. Cervical cancer screening - Cytology - PAP( [place])  3. Fever blister - valACYclovir  (VALTREX ) 1000 MG tablet; Take 1 tabs and repeat in 2 hours as needed for fever blisters.  Dispense: 30 tablet; Refill: 3  4. Acquired hypothyroidism  5. Postmenopausal    No orders of the defined types were placed in this encounter.   Meds:  Meds ordered this encounter  Medications   valACYclovir  (VALTREX ) 1000 MG  tablet    Sig: Take 1 tabs and repeat in 2 hours as needed for fever blisters.    Dispense:  30 tablet    Refill:  3    Follow-up: Return in about 1 year (around 08/30/2025).  Ronal GORMAN Pinal, MD 08/30/2024 8:57 AM "

## 2024-08-30 ENCOUNTER — Encounter (HOSPITAL_BASED_OUTPATIENT_CLINIC_OR_DEPARTMENT_OTHER): Payer: Self-pay | Admitting: Obstetrics & Gynecology

## 2024-08-30 ENCOUNTER — Other Ambulatory Visit (HOSPITAL_COMMUNITY)
Admission: RE | Admit: 2024-08-30 | Discharge: 2024-08-30 | Disposition: A | Source: Ambulatory Visit | Attending: Obstetrics & Gynecology | Admitting: Obstetrics & Gynecology

## 2024-08-30 ENCOUNTER — Ambulatory Visit (INDEPENDENT_AMBULATORY_CARE_PROVIDER_SITE_OTHER): Payer: BC Managed Care – PPO | Admitting: Obstetrics & Gynecology

## 2024-08-30 VITALS — BP 121/84 | HR 68 | Ht 60.0 in | Wt 150.6 lb

## 2024-08-30 DIAGNOSIS — Z1151 Encounter for screening for human papillomavirus (HPV): Secondary | ICD-10-CM | POA: Diagnosis not present

## 2024-08-30 DIAGNOSIS — E039 Hypothyroidism, unspecified: Secondary | ICD-10-CM

## 2024-08-30 DIAGNOSIS — Z124 Encounter for screening for malignant neoplasm of cervix: Secondary | ICD-10-CM | POA: Diagnosis present

## 2024-08-30 DIAGNOSIS — Z780 Asymptomatic menopausal state: Secondary | ICD-10-CM | POA: Diagnosis not present

## 2024-08-30 DIAGNOSIS — B001 Herpesviral vesicular dermatitis: Secondary | ICD-10-CM

## 2024-08-30 DIAGNOSIS — Z01419 Encounter for gynecological examination (general) (routine) without abnormal findings: Secondary | ICD-10-CM

## 2024-08-30 DIAGNOSIS — Z1331 Encounter for screening for depression: Secondary | ICD-10-CM | POA: Diagnosis not present

## 2024-08-30 MED ORDER — VALACYCLOVIR HCL 1 G PO TABS: ORAL_TABLET | ORAL | 3 refills | Status: AC

## 2024-09-03 LAB — CYTOLOGY - PAP
Comment: NEGATIVE
Diagnosis: NEGATIVE
High risk HPV: NEGATIVE

## 2024-09-04 ENCOUNTER — Ambulatory Visit (HOSPITAL_BASED_OUTPATIENT_CLINIC_OR_DEPARTMENT_OTHER): Payer: Self-pay | Admitting: Obstetrics & Gynecology

## 2024-09-06 NOTE — Progress Notes (Signed)
" ° °  GYNECOLOGY  VISIT  CC:   removal of vulvar lesion   HPI: 59 y.o. G0P0000 Married White or Caucasian female here for vulvar lesion removal.  Has a sebaceous cyst on the right labia major that she would like removed.    Consent obtained.    Patient's last menstrual period was 10/20/2016.  Past Medical History:  Diagnosis Date   Asthma    Dyspnea    GERD (gastroesophageal reflux disease)    Hyperlipidemia    Hypothyroidism    Mild vitamin D  deficiency    Nasal polyps    PONV (postoperative nausea and vomiting)     MEDS:  Reviewed in EPIC  ALLERGIES: Patient has no known allergies.  SH:  married, non smoker  Review of Systems  Constitutional: Negative.   Genitourinary: Negative.     PHYSICAL EXAMINATION:    BP 125/72 (BP Location: Left Arm, Patient Position: Sitting, Cuff Size: Normal)   Pulse 71   LMP 10/20/2016 Comment: Menopasual  SpO2 98%     General appearance: alert, cooperative and appears stated age  Pelvic: External genitalia:  sebaceous cyst on outer right labia majora              Urethra:  normal appearing urethra with no masses, tenderness or lesions              Bartholins and Skenes: normal                  Procedure:  area cleansed with Betadine  x 3.  1.0cc 1% Lidocaine  instilled beneath lesion.  Opened with #11 blade.  Sebaceous material removed.  Cyst wall fully removed.  Silver nitrated used for excellent hemostasis.  Pt tolerated procedure well.  Chaperone was present for exam.  Assessment/Plan: 1. Sebaceous cyst of labia (Primary) - post procedure care reviewed including reasons to call for follow up - follow up for routine gyn exam unless signs/symptoms of infection   "

## 2024-09-09 ENCOUNTER — Encounter (HOSPITAL_BASED_OUTPATIENT_CLINIC_OR_DEPARTMENT_OTHER): Payer: Self-pay | Admitting: Obstetrics & Gynecology

## 2024-09-09 ENCOUNTER — Ambulatory Visit (HOSPITAL_BASED_OUTPATIENT_CLINIC_OR_DEPARTMENT_OTHER): Payer: Self-pay | Admitting: Obstetrics & Gynecology

## 2024-09-09 VITALS — BP 125/72 | HR 71

## 2024-09-09 DIAGNOSIS — N907 Vulvar cyst: Secondary | ICD-10-CM

## 2024-09-20 ENCOUNTER — Other Ambulatory Visit: Payer: Self-pay | Admitting: Family Medicine

## 2024-09-20 DIAGNOSIS — Z1231 Encounter for screening mammogram for malignant neoplasm of breast: Secondary | ICD-10-CM

## 2024-10-07 ENCOUNTER — Ambulatory Visit
